# Patient Record
Sex: Female | Born: 1975 | Race: White | Hispanic: No | Smoking: Never smoker
Health system: Southern US, Community
[De-identification: ages and names within clinical notes are randomized; demographics above are authoritative.]

## PROBLEM LIST (undated history)

## (undated) ENCOUNTER — Inpatient Hospital Stay (HOSPITAL_COMMUNITY): Payer: Self-pay

## (undated) DIAGNOSIS — K219 Gastro-esophageal reflux disease without esophagitis: Secondary | ICD-10-CM

## (undated) DIAGNOSIS — N979 Female infertility, unspecified: Secondary | ICD-10-CM

## (undated) DIAGNOSIS — R519 Headache, unspecified: Secondary | ICD-10-CM

## (undated) DIAGNOSIS — F419 Anxiety disorder, unspecified: Secondary | ICD-10-CM

## (undated) DIAGNOSIS — Z87898 Personal history of other specified conditions: Secondary | ICD-10-CM

## (undated) DIAGNOSIS — Z973 Presence of spectacles and contact lenses: Secondary | ICD-10-CM

## (undated) DIAGNOSIS — D649 Anemia, unspecified: Secondary | ICD-10-CM

## (undated) DIAGNOSIS — F32A Depression, unspecified: Secondary | ICD-10-CM

## (undated) DIAGNOSIS — G43909 Migraine, unspecified, not intractable, without status migrainosus: Secondary | ICD-10-CM

## (undated) DIAGNOSIS — K449 Diaphragmatic hernia without obstruction or gangrene: Secondary | ICD-10-CM

## (undated) DIAGNOSIS — G4700 Insomnia, unspecified: Secondary | ICD-10-CM

## (undated) DIAGNOSIS — R51 Headache: Secondary | ICD-10-CM

## (undated) HISTORY — PX: OVUM / OOCYTE RETRIEVAL: SUR1269

## (undated) HISTORY — PX: TUBAL LIGATION: SHX77

## (undated) HISTORY — PX: HERNIA REPAIR: SHX51

## (undated) HISTORY — PX: WISDOM TOOTH EXTRACTION: SHX21

## (undated) HISTORY — PX: OTHER SURGICAL HISTORY: SHX169

---

## 1999-01-13 HISTORY — PX: TUBAL LIGATION: SHX77

## 2010-09-16 DIAGNOSIS — G4700 Insomnia, unspecified: Secondary | ICD-10-CM | POA: Insufficient documentation

## 2010-10-29 DIAGNOSIS — G43709 Chronic migraine without aura, not intractable, without status migrainosus: Secondary | ICD-10-CM | POA: Insufficient documentation

## 2012-01-13 HISTORY — PX: MICROTUBOPLASTY: SHX5401

## 2013-06-19 ENCOUNTER — Other Ambulatory Visit (HOSPITAL_COMMUNITY): Payer: Self-pay | Admitting: Obstetrics and Gynecology

## 2013-06-19 DIAGNOSIS — IMO0002 Reserved for concepts with insufficient information to code with codable children: Secondary | ICD-10-CM

## 2013-06-21 ENCOUNTER — Ambulatory Visit (HOSPITAL_COMMUNITY)
Admission: RE | Admit: 2013-06-21 | Discharge: 2013-06-21 | Disposition: A | Discharge status: Home / Self Care | Payer: Commercial Managed Care - PPO | Source: Ambulatory Visit | Attending: Obstetrics and Gynecology | Admitting: Obstetrics and Gynecology

## 2013-06-21 DIAGNOSIS — N979 Female infertility, unspecified: Secondary | ICD-10-CM | POA: Insufficient documentation

## 2013-06-21 DIAGNOSIS — Z310 Encounter for reversal of previous sterilization: Secondary | ICD-10-CM | POA: Insufficient documentation

## 2013-06-21 DIAGNOSIS — IMO0002 Reserved for concepts with insufficient information to code with codable children: Secondary | ICD-10-CM

## 2013-06-21 MED ORDER — IOHEXOL 300 MG/ML  SOLN
20.0000 mL | Freq: Once | INTRAMUSCULAR | Status: AC | PRN
  Administered 2013-06-21: 20 mL

## 2014-01-08 ENCOUNTER — Encounter (HOSPITAL_COMMUNITY): Payer: Self-pay | Admitting: *Deleted

## 2014-01-08 ENCOUNTER — Inpatient Hospital Stay (HOSPITAL_COMMUNITY): Payer: 59

## 2014-01-08 ENCOUNTER — Inpatient Hospital Stay (HOSPITAL_COMMUNITY)
Admission: AD | Admit: 2014-01-08 | Discharge: 2014-01-08 | Disposition: A | Discharge status: Home / Self Care | Payer: 59 | Source: Ambulatory Visit | Attending: Obstetrics & Gynecology | Admitting: Obstetrics & Gynecology

## 2014-01-08 DIAGNOSIS — Z3A01 Less than 8 weeks gestation of pregnancy: Secondary | ICD-10-CM | POA: Diagnosis not present

## 2014-01-08 DIAGNOSIS — O209 Hemorrhage in early pregnancy, unspecified: Secondary | ICD-10-CM | POA: Diagnosis not present

## 2014-01-08 DIAGNOSIS — O4691 Antepartum hemorrhage, unspecified, first trimester: Secondary | ICD-10-CM

## 2014-01-08 HISTORY — DX: Headache, unspecified: R51.9

## 2014-01-08 HISTORY — DX: Anemia, unspecified: D64.9

## 2014-01-08 HISTORY — DX: Headache: R51

## 2014-01-08 LAB — URINALYSIS, ROUTINE W REFLEX MICROSCOPIC
Bilirubin Urine: NEGATIVE
Glucose, UA: NEGATIVE mg/dL
KETONES UR: NEGATIVE mg/dL
LEUKOCYTES UA: NEGATIVE
Nitrite: NEGATIVE
Protein, ur: NEGATIVE mg/dL
Specific Gravity, Urine: <1.005 — ABNORMAL LOW (ref 1.005–1.030)
Urobilinogen, UA: 0.2 mg/dL (ref 0.0–1.0)
pH: 6.5 (ref 5.0–8.0)

## 2014-01-08 LAB — URINE MICROSCOPIC-ADD ON

## 2014-01-08 LAB — HCG, QUANTITATIVE, PREGNANCY: hCG, Beta Chain, Quant, S: 209 m[IU]/mL — ABNORMAL HIGH (ref <5)

## 2014-01-08 LAB — ABO/RH: ABO/RH(D): A POS

## 2014-01-08 NOTE — MAU Provider Note (Signed)
First Provider Initiated Contact with Patient 01/08/14 1913      Chief Complaint:  Vaginal Bleeding   Nicole David is  38 y.o. [id number] at 6.3 weeks by LMP presents complaining of Vaginal Bleeding  She started having some scant pink bleeding, mainly just when wiping, 12/15. It has gotten a little heavier over the past few days, but still light (just wearing panty liners).  No recent intercourse.  Denies cramping.  Had + QHCG at [facility] 12/15, and a + UPT at [facility] shortly after that.  Has not had an [place] yet.    Obstetrical/Gynecological History: OB History    Gravida Para Term Preterm AB TAB SAB Ectopic Multiple Living   5 4 1 3      4      Past Medical History: Past Medical History  Diagnosis Date  . Anemia   . Headache     Past Surgical History: Past Surgical History  Procedure Laterality Date  . Tubal ligation    . Tubal reversal      2014  . Wisdom tooth extraction      Family History: History reviewed. No pertinent family history.  Social History: History  Substance Use Topics  . Smoking status: Never Smoker   . Smokeless tobacco: Not on file  . Alcohol Use: No    Allergies:  Allergies  Allergen Reactions  . Sulfa Antibiotics Nausea And Vomiting and Rash  . Zithromax [Azithromycin] Rash  . Zyrtec [Cetirizine] Rash    Meds:  Prescriptions prior to admission  Medication Sig Dispense Refill Last Dose  . Prenatal Vit-Fe Fumarate-FA (PRENATAL MULTIVITAMIN) TABS tablet Take 1 tablet by mouth daily at 12 noon.   01/08/2014 at Unknown time    Review of Systems   Constitutional: Negative for fever and chills Eyes: Negative for visual disturbances Respiratory: Negative for shortness of breath, dyspnea Cardiovascular: Negative for chest pain or palpitations  Gastrointestinal: Negative for vomiting, diarrhea and constipation Genitourinary: Negative for dysuria and urgency.  No vaginal itching, irritation, change in discharge. Musculoskeletal:  Negative for back pain, joint pain, myalgias  Neurological: Negative for dizziness and headaches     Physical Exam  Blood pressure 116/71, pulse 76, temperature 98.9 F (37.2 C), temperature source Oral, resp. rate 16, weight 62.143 kg (137 lb), last menstrual period 11/24/2013. GENERAL: Well-developed, well-nourished female in no acute distress.  LUNGS: Clear to auscultation bilaterally.  HEART: Regular rate and rhythm. ABDOMEN: Soft, nontender, nondistended, gravid.  EXTREMITIES: Nontender, no edema, 2+ distal pulses. DTR's 2+ PELVIC:  PT REFUSED PELVIC EXAM.  Concerned "it could make things worse".  Advised that a pelvic exam is indicated to R/O subclinical infection (a primary cause of VB ). Pt understands and declines  Labs: Results for orders placed or performed during the hospital encounter of 01/08/14 (from the past 24 hour(s))  Urinalysis, Routine w reflex microscopic   Collection Time: 01/08/14  6:52 PM  Result Value Ref Range   Color, Urine YELLOW YELLOW   APPearance CLEAR CLEAR   Specific Gravity, Urine <1.005 (L) 1.005 - 1.030   pH 6.5 5.0 - 8.0   Glucose, UA NEGATIVE NEGATIVE mg/dL   Hgb urine dipstick LARGE (A) NEGATIVE   Bilirubin Urine NEGATIVE NEGATIVE   Ketones, ur NEGATIVE NEGATIVE mg/dL   Protein, ur NEGATIVE NEGATIVE mg/dL   Urobilinogen, UA 0.2 0.0 - 1.0 mg/dL   Nitrite NEGATIVE NEGATIVE   Leukocytes, UA NEGATIVE NEGATIVE  Urine microscopic-add on   Collection Time: 01/08/14  6:52 PM  Result Value Ref Range   Squamous Epithelial / LPF RARE RARE   WBC, UA 0-2 <3 WBC/hpf   RBC / HPF 7-10 <3 RBC/hpf   Bacteria, UA FEW (A) RARE   Imaging Studies:  CLINICAL DATA: Vaginal bleeding in first trimester pregnancy. Quantitative beta HCG 209.  EXAM: OBSTETRIC <14 WK [place] AND TRANSVAGINAL OB US  TECHNIQUE: Both transabdominal and transvaginal ultrasound examinations were performed for complete evaluation of the gestation as well as the maternal uterus,  adnexal regions, and pelvic cul-de-sac. Transvaginal technique was performed to assess early pregnancy.  COMPARISON: None.  FINDINGS: No intra or extrauterine gestational sac identified. No adnexal mass. The ovaries are symmetric in size and normal in appearance. There is no free pelvic fluid.  IMPRESSION: Pregnancy of unknown location. Differential considerations include intrauterine gestation too early to be sonographically visualized, spontaneous abortion, or ectopic pregnancy. Consider follow-up ultrasound in 10 days and serial quantitative beta HCG follow-up.   Electronically Signed  By: Jorje Guild M.D.  On: 01/08/2014 21:30   Assessment: Nicole David is  38 y.o. [id number] at Unknown presents with viable vs noviable pregnancy; intrauterine vs ectopic.  Plan: Ectopic precautions.  Discussed with Dr. Alwyn Pea.  Pt to F/U Wed afternoon (pt prefers Thursday am) for [facility]  CRESENZO-DISHMAN,Rekita Miotke 12/28/20157:54 PM  '

## 2014-01-08 NOTE — MAU Note (Signed)
In restroom when called

## 2014-01-08 NOTE — MAU Note (Signed)
Called dr's office, has been having some bleeding since the 25th. Was told to come in. Was a little heavier.  Normally sees it when she wipes after using restroom. Started as hot pink, became bright red, now has slowed some and is pinkish to red.

## 2014-01-17 ENCOUNTER — Inpatient Hospital Stay (HOSPITAL_COMMUNITY)
Admission: AD | Admit: 2014-01-17 | Discharge: 2014-01-17 | Disposition: A | Discharge status: Home / Self Care | Payer: 59 | Source: Ambulatory Visit | Attending: Obstetrics | Admitting: Obstetrics

## 2014-01-17 DIAGNOSIS — O001 Tubal pregnancy: Secondary | ICD-10-CM | POA: Diagnosis not present

## 2014-01-17 LAB — CBC WITH DIFFERENTIAL/PLATELET
BASOS PCT: 0 % (ref 0–1)
Basophils Absolute: 0 10*3/uL (ref 0.0–0.1)
Eosinophils Absolute: 0.3 10*3/uL (ref 0.0–0.7)
Eosinophils Relative: 3 % (ref 0–5)
HCT: 40.2 % (ref 36.0–46.0)
Hemoglobin: 14.1 g/dL (ref 12.0–15.0)
LYMPHS ABS: 2.4 10*3/uL (ref 0.7–4.0)
LYMPHS PCT: 24 % (ref 12–46)
MCH: 32.9 pg (ref 26.0–34.0)
MCHC: 35.1 g/dL (ref 30.0–36.0)
MCV: 93.7 fL (ref 78.0–100.0)
MONO ABS: 0.7 10*3/uL (ref 0.1–1.0)
MONOS PCT: 7 % (ref 3–12)
NEUTROS ABS: 6.3 10*3/uL (ref 1.7–7.7)
Neutrophils Relative %: 66 % (ref 43–77)
PLATELETS: 180 10*3/uL (ref 150–400)
RBC: 4.29 MIL/uL (ref 3.87–5.11)
RDW: 12.2 % (ref 11.5–15.5)
WBC: 9.7 10*3/uL (ref 4.0–10.5)

## 2014-01-17 LAB — CREATININE, SERUM: CREATININE: 0.65 mg/dL (ref 0.50–1.10)

## 2014-01-17 LAB — HCG, QUANTITATIVE, PREGNANCY: HCG, BETA CHAIN, QUANT, S: 212 m[IU]/mL — AB (ref <5)

## 2014-01-17 LAB — BUN: BUN: 10 mg/dL (ref 6–23)

## 2014-01-17 LAB — AST: AST: 22 U/L (ref 0–37)

## 2014-01-17 MED ORDER — METHOTREXATE INJECTION FOR WOMEN'S HOSPITAL
50.0000 mg/m2 | Freq: Once | INTRAMUSCULAR | Status: AC
  Administered 2014-01-17: 80 mg via INTRAMUSCULAR
  Filled 2014-01-17: qty 1.6

## 2014-01-17 NOTE — MAU Note (Signed)
Verbal orders from Dr. Ouida Sills received for Beta HCG quant.  MD will come put orders in for Methotrexate up arrival.

## 2014-01-17 NOTE — MAU Note (Signed)
Dr sent her in for methotrexate.   preg hormone level  Not really changing, no preg seen on [place]

## 2014-01-17 NOTE — MAU Note (Signed)
hcg level 209 12/28 here in MAU, 2 days later 213, today 204.  Nothing in uterus.  Hx of tubal reversal.  Due to risk, Dr Ouida Sills wanting MTX.  Pt will return here for follow up blood work. Pt may leave while lab is processing per MD.

## 2014-01-20 ENCOUNTER — Inpatient Hospital Stay (HOSPITAL_COMMUNITY)
Admission: AD | Admit: 2014-01-20 | Discharge: 2014-01-20 | Disposition: A | Discharge status: Home / Self Care | Payer: 59 | Source: Ambulatory Visit | Attending: Obstetrics and Gynecology | Admitting: Obstetrics and Gynecology

## 2014-01-20 ENCOUNTER — Telehealth: Payer: Self-pay | Admitting: Advanced Practice Midwife

## 2014-01-20 DIAGNOSIS — O001 Tubal pregnancy: Secondary | ICD-10-CM | POA: Diagnosis not present

## 2014-01-20 LAB — HCG, QUANTITATIVE, PREGNANCY: hCG, Beta Chain, Quant, S: 165 m[IU]/mL — ABNORMAL HIGH (ref <5)

## 2014-01-20 NOTE — MAU Note (Signed)
Monna Fam NP in Triage to see pt. PT does not want to wait for lab results. Only [place] and understands may need to return depending on lab results. Have # [phone] to contact pt with results. Understands to return Day 7 for repeat labs or sooner for any problems. Will be seen wkly at office for follow up. To lobby and lab called

## 2014-01-20 NOTE — Telephone Encounter (Signed)
Left message for pt to return call about lab results.  Pt Day 4 labs following MTX indicate quant hcg decrease.  She should return on Day 7 as scheduled.  Pt returned call.  Lab result explained and questions answered.  Pt to return on Day 7 as scheduled.

## 2014-01-20 NOTE — MAU Note (Signed)
Fatima Blank CNM notified of lab results. She will call pt at home with results and discuss plan of care for f/u labs on day #7

## 2014-01-20 NOTE — MAU Note (Signed)
Here for repeat BHCG  After receiving MTX on Weds 1/6. Denies any problems or pain

## 2014-01-21 NOTE — Telephone Encounter (Signed)
Left message for pt to return call about lab results.  Pt Day 4 labs following MTX indicate quant hcg decrease.  She should return on Day 7 as scheduled.  Pt returned call.  Info given.

## 2014-01-23 ENCOUNTER — Inpatient Hospital Stay (HOSPITAL_COMMUNITY)
Admission: AD | Admit: 2014-01-23 | Discharge: 2014-01-23 | Disposition: A | Discharge status: Home / Self Care | Payer: 59 | Source: Ambulatory Visit | Attending: Obstetrics & Gynecology | Admitting: Obstetrics & Gynecology

## 2014-01-23 DIAGNOSIS — O009 Unspecified ectopic pregnancy without intrauterine pregnancy: Secondary | ICD-10-CM

## 2014-01-23 DIAGNOSIS — O001 Tubal pregnancy: Secondary | ICD-10-CM | POA: Insufficient documentation

## 2014-01-23 LAB — HCG, QUANTITATIVE, PREGNANCY: hCG, Beta Chain, Quant, S: 129 m[IU]/mL — ABNORMAL HIGH (ref <5)

## 2014-01-23 NOTE — MAU Note (Signed)
Pt reports no vaginal bleeding and no cramping today.

## 2014-01-23 NOTE — MAU Provider Note (Signed)
Pt is post MTX Day 7 Pt was treated on 01/17/2014 by Dr. Ouida Sills Day 1 HCG 212 01/20/2014 Day 4 HCG-165 [id number] Day 7 HCG 129 (today) Pt denies spotting/bleeding cramping        Results for [facility] [employer] [facility] (MRN [id number]) as of 01/23/2014 21:56  Ref. Range 01/08/2014 21:25 01/17/2014 17:10 01/20/2014 19:45 01/23/2014 19:45  hCG, Beta Chain, Quant, S Latest Range: <5 mIU/mL  212 (H) 165 (H) 129 (H)   Results reported to Dr. Alwyn Pea Pt to follow up in 1 week (Wed 01/31/2014) with dr. Tonette Bihari office [place] if increase in pain or bleeding Sherolyn Buba, WHNP-BC

## 2014-11-13 ENCOUNTER — Encounter (HOSPITAL_COMMUNITY): Payer: Self-pay | Admitting: *Deleted

## 2015-06-23 IMAGING — RF DG HYSTEROGRAM
6 series · 6 of 6 positions shown · non-contrast
Comparison: none

[Series 1: run · 1 of 1 slices shown (1 of 6)]
[im 1/1]
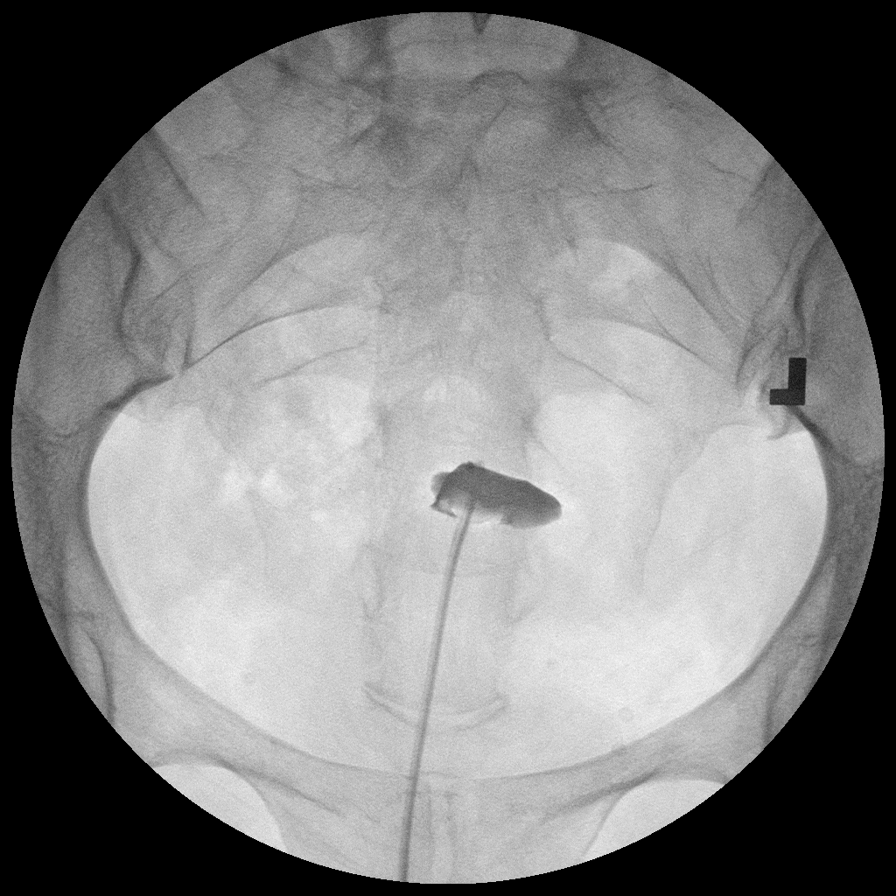

[Series 2: run · 1 of 1 slices shown (2 of 6)]
[im 1/1]
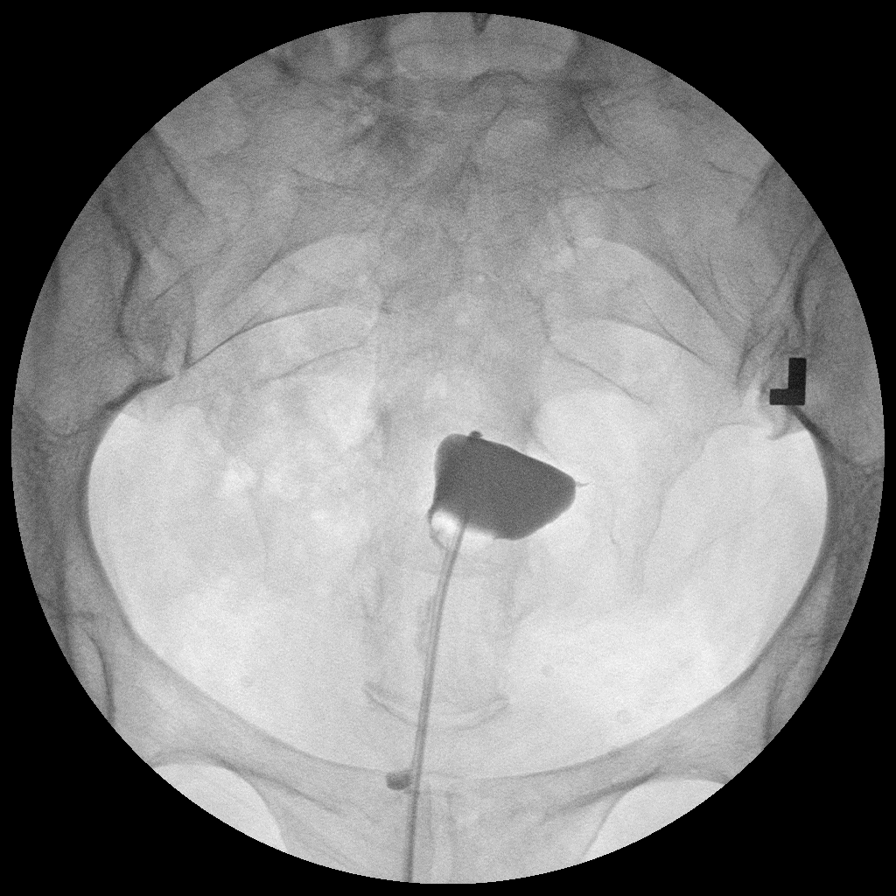

[Series 3: run · 1 of 1 slices shown (3 of 6)]
[im 1/1]
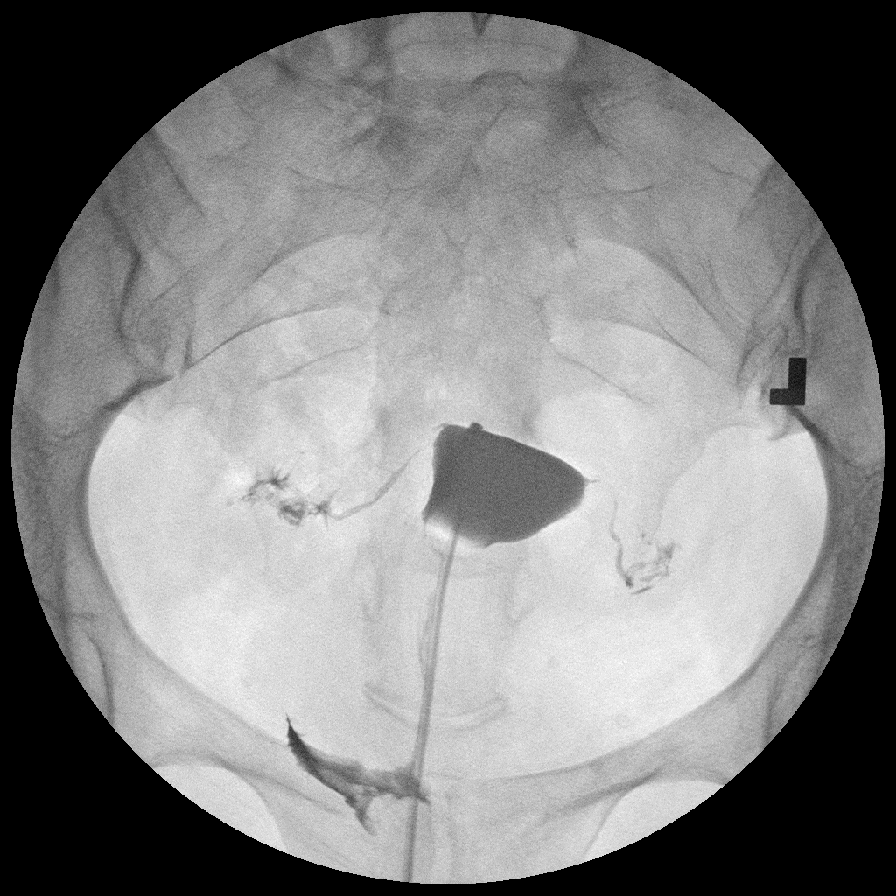

[Series 4: run · 1 of 1 slices shown (4 of 6)]
[im 1/1]
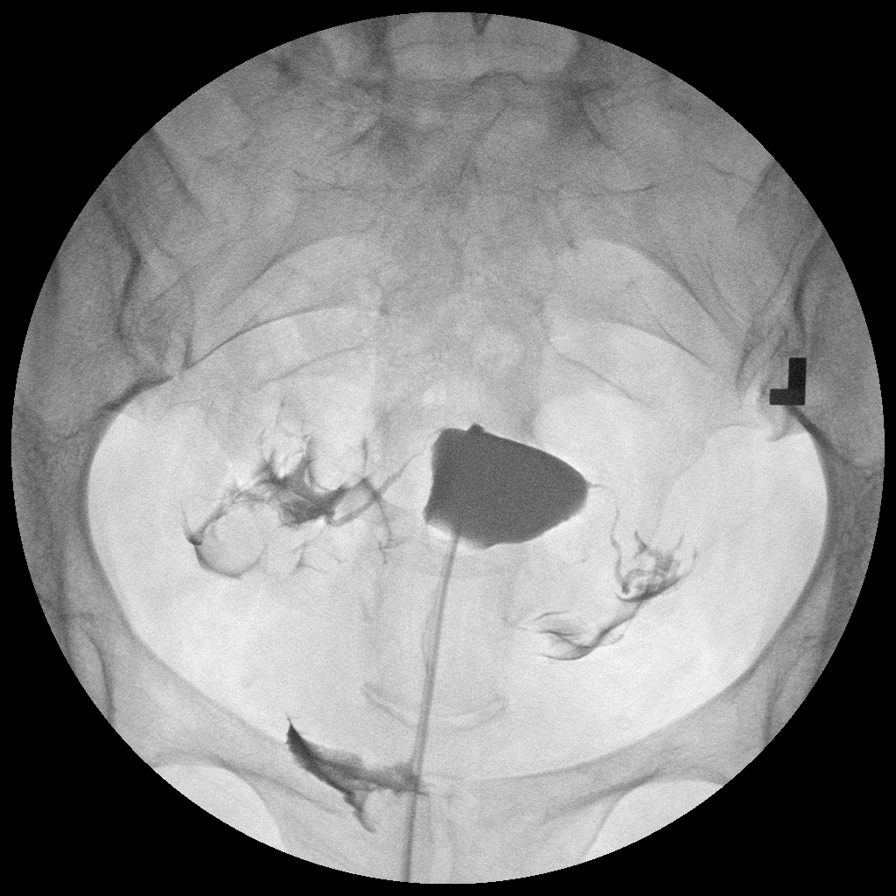

[Series 5: run · 1 of 1 slices shown (5 of 6)]
[im 1/1]
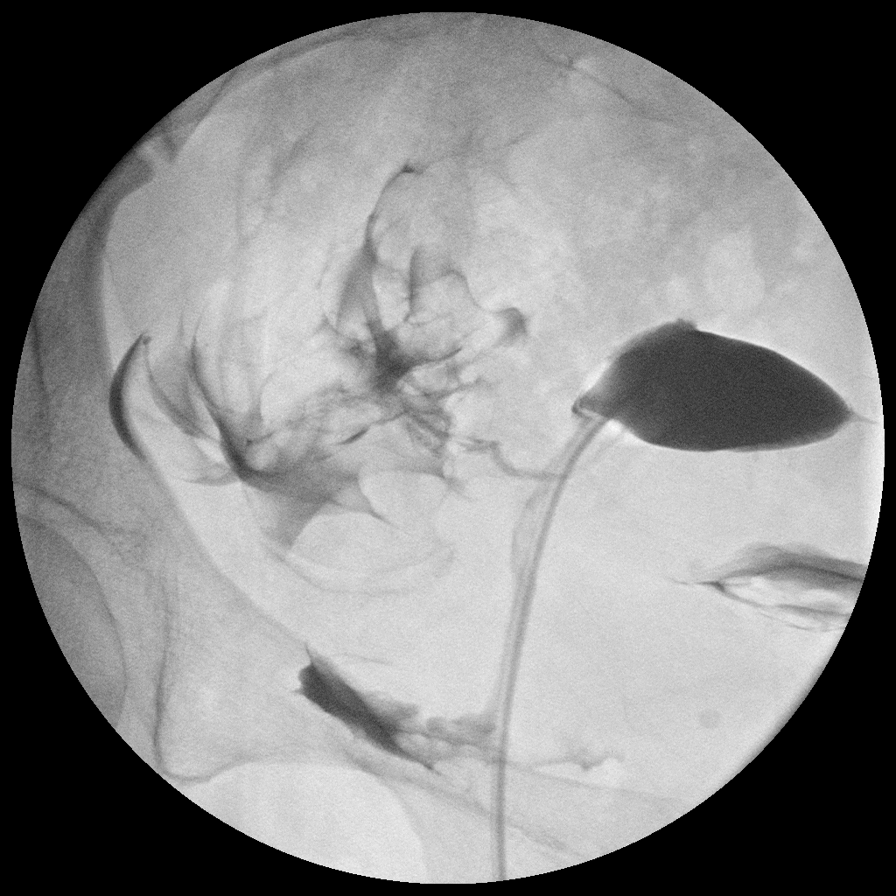

[Series 6: run · 1 of 1 slices shown (6 of 6)]
[im 1/1]
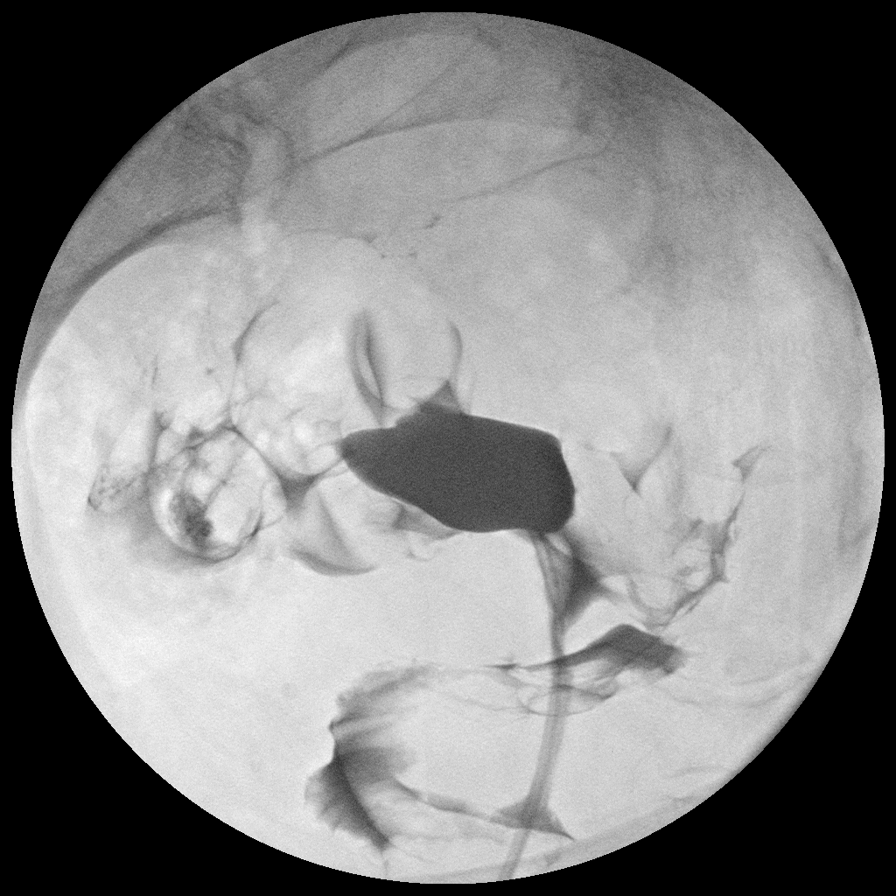

[6 of 6 positions shown; findings below may reference images not displayed]

Canned report from images found in remote index.

Refer to host system for actual result text.

## 2016-01-10 IMAGING — US US OB COMP LESS 14 WK
1 series · 14 of 28 positions shown · non-contrast
Comparison: None.

CLINICAL DATA: Vaginal bleeding in first trimester pregnancy.
Quantitative beta HCG 209.

EXAM:
OBSTETRIC <14 WK US AND TRANSVAGINAL OB US
TECHNIQUE: Both transabdominal and transvaginal ultrasound examinations were
performed for complete evaluation of the gestation as well as the
maternal uterus, adnexal regions, and pelvic cul-de-sac.
Transvaginal technique was performed to assess early pregnancy.

[Series 1: us ob comp less 14 wks · 14 of 47 slices shown]
[im 2/47]
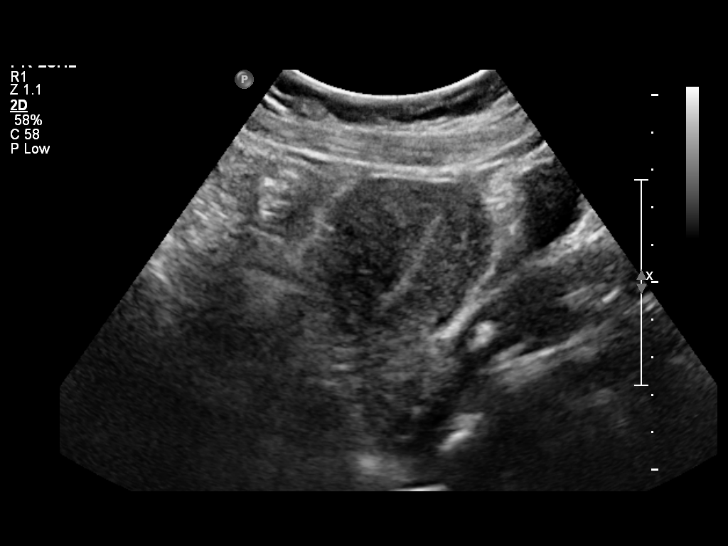
[im 6/47]
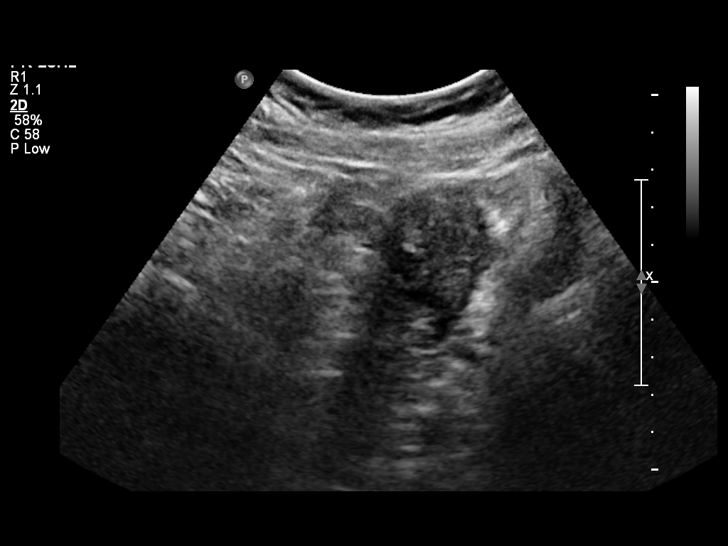
[im 9/47]
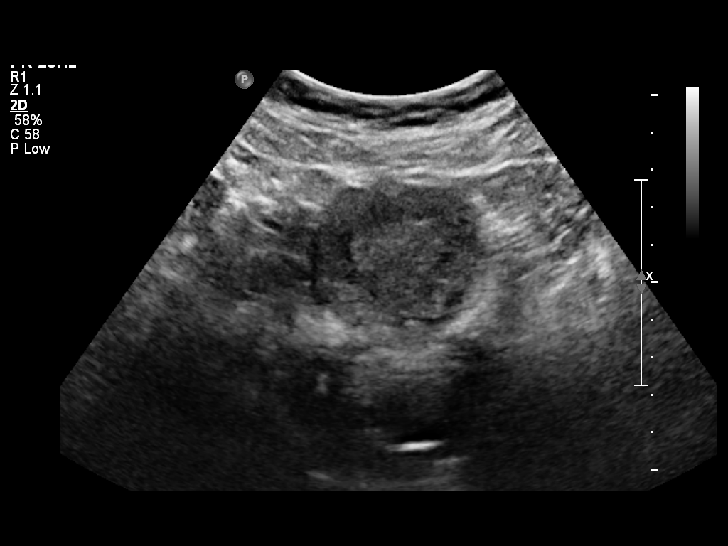
[im 12/47]
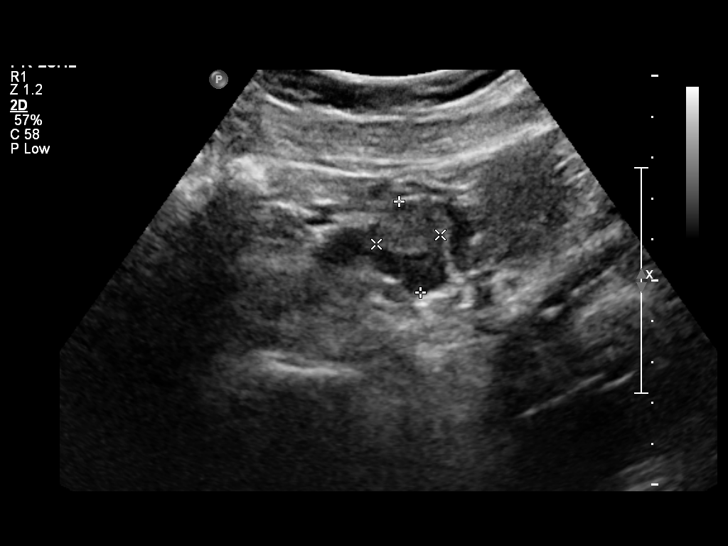
[im 16/47]
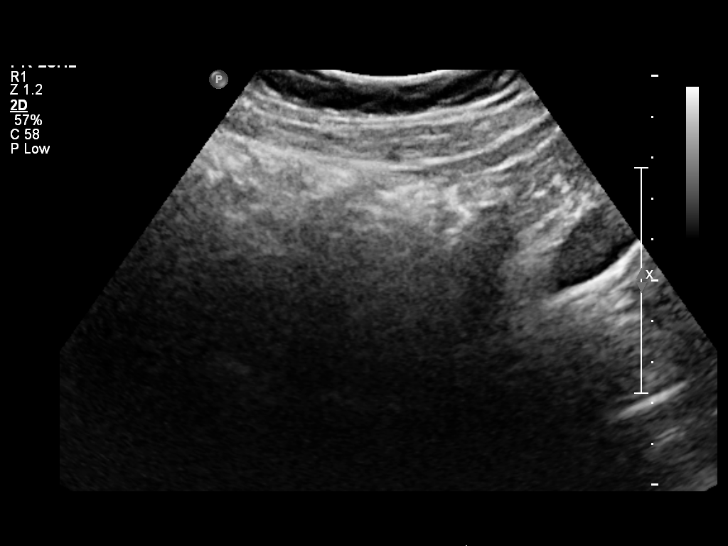
[im 19/47]
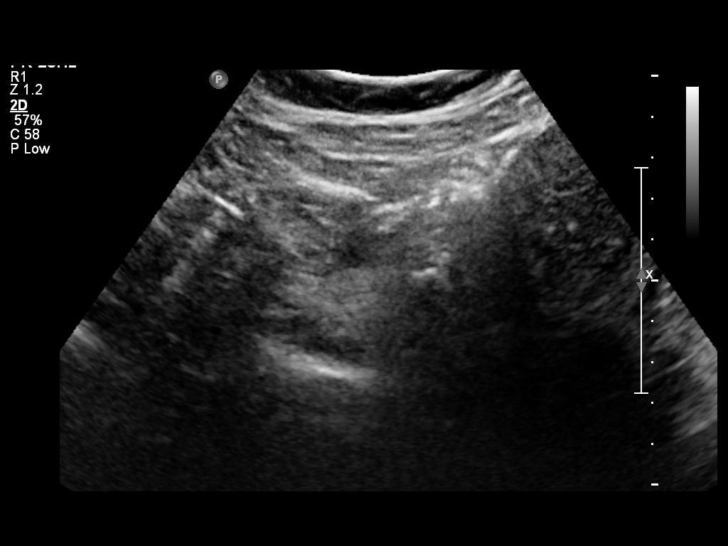
[im 23/47]
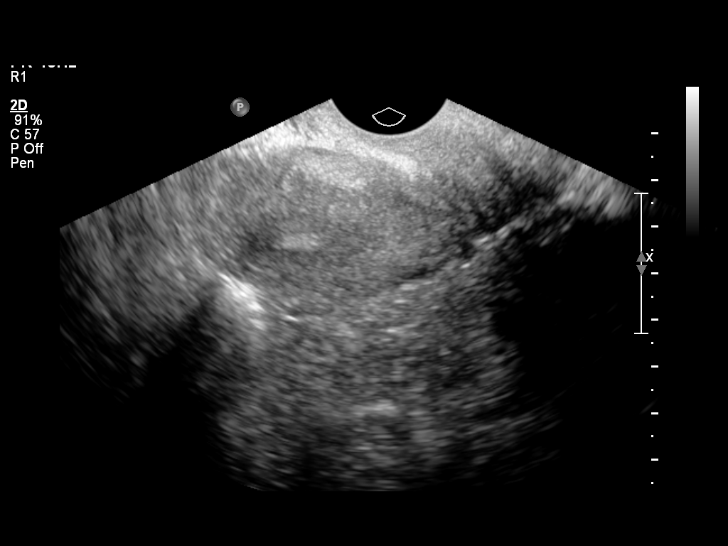
[im 26/47]
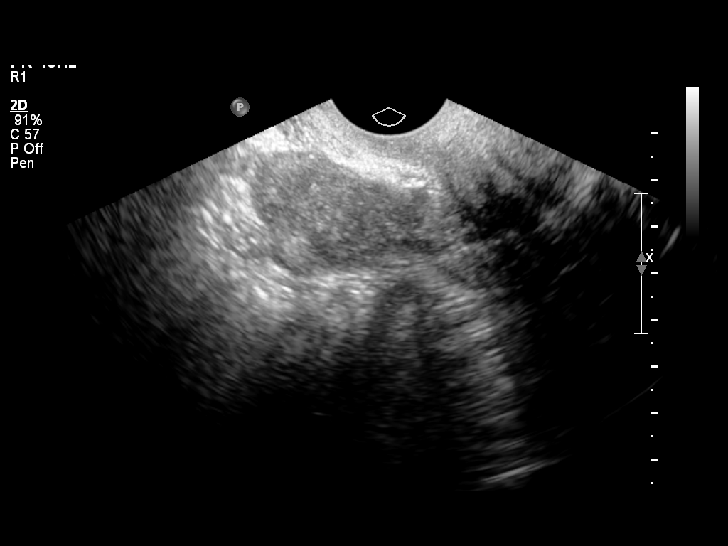
[im 29/47]
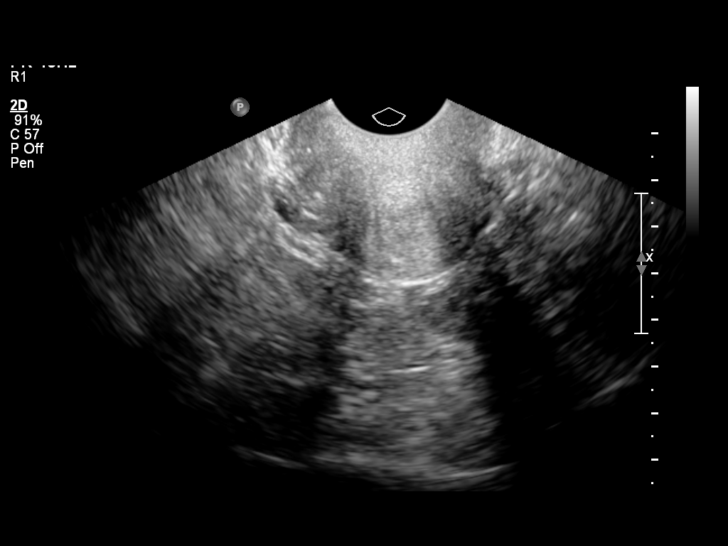
[im 33/47]
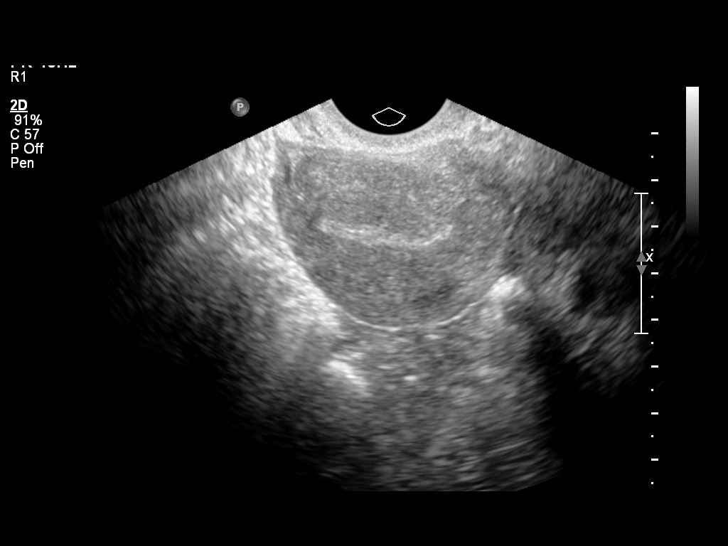
[im 36/47]
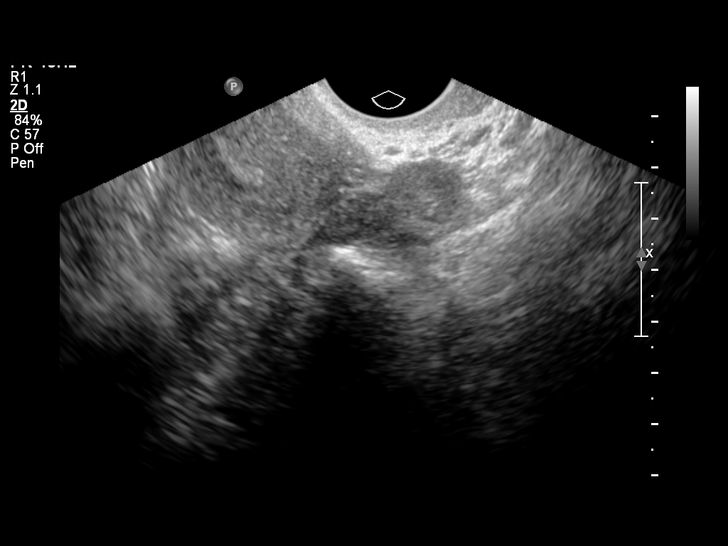
[im 40/47]
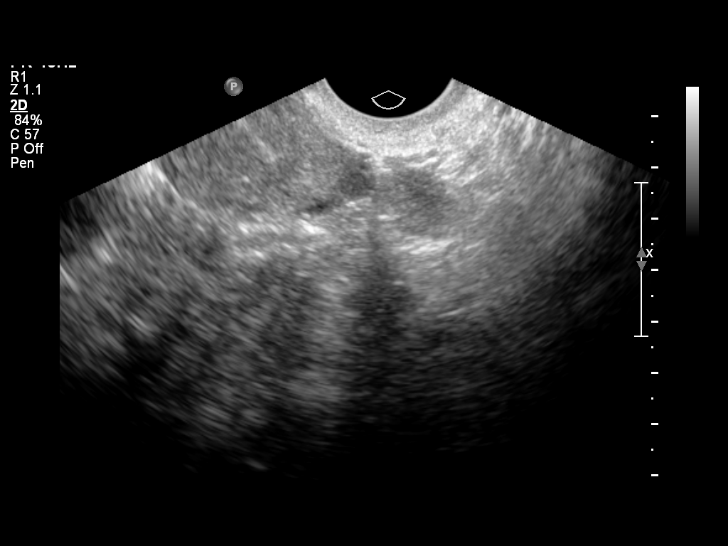
[im 43/47]
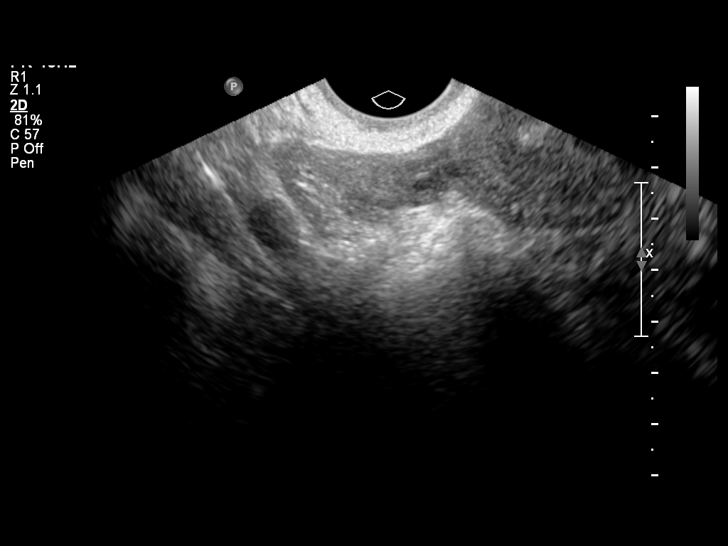
[im 47/47]
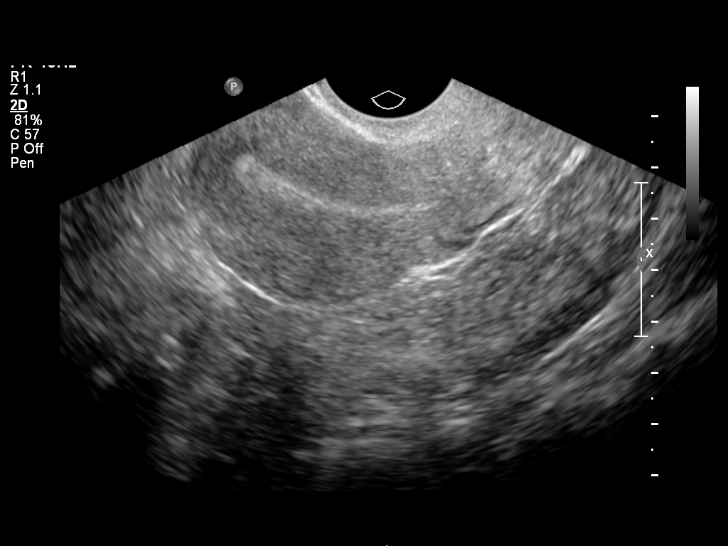

[14 of 28 positions shown; findings below may reference images not displayed]

FINDINGS: No intra or extrauterine gestational sac identified. No adnexal
mass. The ovaries are symmetric in size and normal in appearance.
There is no free pelvic fluid.
IMPRESSION: Pregnancy of unknown location. Differential considerations include
intrauterine gestation too early to be sonographically visualized,
spontaneous abortion, or ectopic pregnancy. Consider follow-up
ultrasound in 10 days and serial quantitative beta HCG follow-up.

## 2018-06-10 DIAGNOSIS — E538 Deficiency of other specified B group vitamins: Secondary | ICD-10-CM | POA: Insufficient documentation

## 2018-06-10 DIAGNOSIS — E559 Vitamin D deficiency, unspecified: Secondary | ICD-10-CM | POA: Insufficient documentation

## 2019-02-17 DIAGNOSIS — D1771 Benign lipomatous neoplasm of kidney: Secondary | ICD-10-CM | POA: Insufficient documentation

## 2019-08-13 HISTORY — PX: ESOPHAGOGASTRODUODENOSCOPY: SHX1529

## 2020-05-31 ENCOUNTER — Other Ambulatory Visit: Payer: Self-pay

## 2020-05-31 ENCOUNTER — Encounter (HOSPITAL_BASED_OUTPATIENT_CLINIC_OR_DEPARTMENT_OTHER): Payer: Self-pay | Admitting: Obstetrics and Gynecology

## 2020-05-31 NOTE — Progress Notes (Signed)
Spoke w/ via phone for pre-op interview--- Pt Lab needs dos----  No (per anes)/  Pre-op orders pending             Lab results------ no COVID test -----patient states asymptomatic no test needed Arrive at ------- 0530 on 06-04-2020 NPO after MN NO Solid Food.  Clear liquids from MN until--- 0430 Med rec completed Medications to take morning of surgery ----- Singulair Diabetic medication ----- n/a Patient instructed to bring photo id and insurance card day of surgery Patient aware to have Driver (ride ) / caregiver    for 24 hours after surgery -- sig other, Thurman Coyer Patient Special Instructions ----- n/a Pre-Op special Istructions ----- case just added on today, orders pending Patient verbalized understanding of instructions that were given at this phone interview. Patient denies shortness of breath, chest pain, fever, cough at this phone interview.

## 2020-06-03 NOTE — Anesthesia Preprocedure Evaluation (Addendum)
Anesthesia Evaluation  Patient identified by MRN, date of birth, ID band Patient awake    Reviewed: Allergy & Precautions, NPO status , Patient's Chart, lab work & pertinent test results  History of Anesthesia Complications Negative for: history of anesthetic complications  Airway Mallampati: I  TM Distance: >3 FB Neck ROM: Full    Dental  (+) Dental Advisory Given   Pulmonary  Seasonal allergies   breath sounds clear to auscultation       Cardiovascular negative cardio ROS   Rhythm:Regular Rate:Normal     Neuro/Psych  Headaches, Anxiety Depression    GI/Hepatic Neg liver ROS, GERD  Poorly Controlled,  Endo/Other  negative endocrine ROS  Renal/GU negative Renal ROS     Musculoskeletal   Abdominal   Peds  Hematology negative hematology ROS (+)   Anesthesia Other Findings   Reproductive/Obstetrics (+) Pregnancy                            Anesthesia Physical Anesthesia Plan  ASA: II  Anesthesia Plan: General   Post-op Pain Management:    Induction: Intravenous  PONV Risk Score and Plan: 3 and Ondansetron, Dexamethasone and Scopolamine patch - Pre-op  Airway Management Planned: Oral ETT  Additional Equipment: None  Intra-op Plan:   Post-operative Plan: Extubation in OR  Informed Consent: I have reviewed the patients History and Physical, chart, labs and discussed the procedure including the risks, benefits and alternatives for the proposed anesthesia with the patient or authorized representative who has indicated his/her understanding and acceptance.     Dental advisory given  Plan Discussed with: CRNA and Surgeon  Anesthesia Plan Comments:        Anesthesia Quick Evaluation

## 2020-06-04 ENCOUNTER — Encounter (HOSPITAL_BASED_OUTPATIENT_CLINIC_OR_DEPARTMENT_OTHER)
Admission: RE | Disposition: A | Discharge status: Home / Self Care | Payer: Self-pay | Source: Home / Self Care | Attending: Obstetrics and Gynecology

## 2020-06-04 ENCOUNTER — Ambulatory Visit (HOSPITAL_BASED_OUTPATIENT_CLINIC_OR_DEPARTMENT_OTHER): Payer: BC Managed Care – PPO | Admitting: Anesthesiology

## 2020-06-04 ENCOUNTER — Other Ambulatory Visit: Payer: Self-pay

## 2020-06-04 ENCOUNTER — Encounter (HOSPITAL_BASED_OUTPATIENT_CLINIC_OR_DEPARTMENT_OTHER): Payer: Self-pay | Admitting: Obstetrics and Gynecology

## 2020-06-04 ENCOUNTER — Ambulatory Visit (HOSPITAL_BASED_OUTPATIENT_CLINIC_OR_DEPARTMENT_OTHER)
Admission: RE | Admit: 2020-06-04 | Discharge: 2020-06-04 | Disposition: A | Discharge status: Home / Self Care | Payer: BC Managed Care – PPO | Attending: Obstetrics and Gynecology | Admitting: Obstetrics and Gynecology

## 2020-06-04 DIAGNOSIS — Z881 Allergy status to other antibiotic agents status: Secondary | ICD-10-CM | POA: Diagnosis not present

## 2020-06-04 DIAGNOSIS — O411090 Infection of amniotic sac and membranes, unspecified, unspecified trimester, not applicable or unspecified: Secondary | ICD-10-CM | POA: Insufficient documentation

## 2020-06-04 DIAGNOSIS — Z3A00 Weeks of gestation of pregnancy not specified: Secondary | ICD-10-CM | POA: Insufficient documentation

## 2020-06-04 DIAGNOSIS — Z882 Allergy status to sulfonamides status: Secondary | ICD-10-CM | POA: Insufficient documentation

## 2020-06-04 DIAGNOSIS — Z8719 Personal history of other diseases of the digestive system: Secondary | ICD-10-CM | POA: Diagnosis not present

## 2020-06-04 DIAGNOSIS — Z79899 Other long term (current) drug therapy: Secondary | ICD-10-CM | POA: Insufficient documentation

## 2020-06-04 DIAGNOSIS — Z888 Allergy status to other drugs, medicaments and biological substances status: Secondary | ICD-10-CM | POA: Diagnosis not present

## 2020-06-04 DIAGNOSIS — O021 Missed abortion: Secondary | ICD-10-CM | POA: Insufficient documentation

## 2020-06-04 HISTORY — DX: Migraine, unspecified, not intractable, without status migrainosus: G43.909

## 2020-06-04 HISTORY — DX: Female infertility, unspecified: N97.9

## 2020-06-04 HISTORY — PX: DILATION AND EVACUATION: SHX1459

## 2020-06-04 HISTORY — DX: Presence of spectacles and contact lenses: Z97.3

## 2020-06-04 HISTORY — DX: Anxiety disorder, unspecified: F41.9

## 2020-06-04 HISTORY — DX: Personal history of other specified conditions: Z87.898

## 2020-06-04 HISTORY — DX: Insomnia, unspecified: G47.00

## 2020-06-04 HISTORY — DX: Diaphragmatic hernia without obstruction or gangrene: K44.9

## 2020-06-04 HISTORY — DX: Depression, unspecified: F32.A

## 2020-06-04 HISTORY — DX: Gastro-esophageal reflux disease without esophagitis: K21.9

## 2020-06-04 LAB — CBC
HCT: 40.8 % (ref 36.0–46.0)
Hemoglobin: 14.2 g/dL (ref 12.0–15.0)
MCH: 32.7 pg (ref 26.0–34.0)
MCHC: 34.8 g/dL (ref 30.0–36.0)
MCV: 94 fL (ref 80.0–100.0)
Platelets: 169 10*3/uL (ref 150–400)
RBC: 4.34 MIL/uL (ref 3.87–5.11)
RDW: 12.8 % (ref 11.5–15.5)
WBC: 12.5 10*3/uL — ABNORMAL HIGH (ref 4.0–10.5)
nRBC: 0 % (ref 0.0–0.2)

## 2020-06-04 LAB — PREPARE RBC (CROSSMATCH)

## 2020-06-04 SURGERY — DILATION AND EVACUATION, UTERUS
Anesthesia: General | Site: Vagina

## 2020-06-04 MED ORDER — EPHEDRINE 5 MG/ML INJ
INTRAVENOUS | Status: AC
  Filled 2020-06-04: qty 10

## 2020-06-04 MED ORDER — SUCCINYLCHOLINE CHLORIDE 200 MG/10ML IV SOSY
PREFILLED_SYRINGE | INTRAVENOUS | Status: AC
  Filled 2020-06-04: qty 10

## 2020-06-04 MED ORDER — LIDOCAINE 2% (20 MG/ML) 5 ML SYRINGE
INTRAMUSCULAR | Status: AC
  Filled 2020-06-04: qty 5

## 2020-06-04 MED ORDER — CARBOPROST TROMETHAMINE 250 MCG/ML IM SOLN
INTRAMUSCULAR | Status: AC
  Filled 2020-06-04: qty 1

## 2020-06-04 MED ORDER — ONDANSETRON HCL 4 MG/2ML IJ SOLN
INTRAMUSCULAR | Status: AC
  Filled 2020-06-04: qty 2

## 2020-06-04 MED ORDER — PROMETHAZINE HCL 25 MG/ML IJ SOLN: 6.2500 mg | INTRAMUSCULAR | Status: DC | PRN

## 2020-06-04 MED ORDER — MEPERIDINE HCL 25 MG/ML IJ SOLN: 6.2500 mg | INTRAMUSCULAR | Status: DC | PRN

## 2020-06-04 MED ORDER — PROPOFOL 500 MG/50ML IV EMUL
INTRAVENOUS | Status: AC
  Filled 2020-06-04: qty 50

## 2020-06-04 MED ORDER — METHYLERGONOVINE MALEATE 0.2 MG/ML IJ SOLN
INTRAMUSCULAR | Status: AC
  Filled 2020-06-04: qty 1

## 2020-06-04 MED ORDER — FENTANYL CITRATE (PF) 100 MCG/2ML IJ SOLN
INTRAMUSCULAR | Status: AC
  Filled 2020-06-04: qty 2

## 2020-06-04 MED ORDER — FENTANYL CITRATE (PF) 100 MCG/2ML IJ SOLN: 25.0000 ug | INTRAMUSCULAR | Status: DC | PRN

## 2020-06-04 MED ORDER — FENTANYL CITRATE (PF) 100 MCG/2ML IJ SOLN
INTRAMUSCULAR | Status: DC | PRN
  Administered 2020-06-04: 100 ug via INTRAVENOUS

## 2020-06-04 MED ORDER — PROPOFOL 10 MG/ML IV BOLUS
INTRAVENOUS | Status: DC | PRN
  Administered 2020-06-04: 200 mg via INTRAVENOUS

## 2020-06-04 MED ORDER — OXYCODONE HCL 5 MG PO TABS: 5.0000 mg | ORAL_TABLET | Freq: Once | ORAL | Status: DC | PRN

## 2020-06-04 MED ORDER — LACTATED RINGERS IV SOLN: INTRAVENOUS | Status: DC

## 2020-06-04 MED ORDER — MIDAZOLAM HCL 5 MG/5ML IJ SOLN
INTRAMUSCULAR | Status: DC | PRN
  Administered 2020-06-04: 2 mg via INTRAVENOUS

## 2020-06-04 MED ORDER — MIDAZOLAM HCL 2 MG/2ML IJ SOLN: 0.5000 mg | Freq: Once | INTRAMUSCULAR | Status: DC | PRN

## 2020-06-04 MED ORDER — KETOROLAC TROMETHAMINE 30 MG/ML IJ SOLN
INTRAMUSCULAR | Status: AC
  Filled 2020-06-04: qty 1

## 2020-06-04 MED ORDER — DEXAMETHASONE SODIUM PHOSPHATE 10 MG/ML IJ SOLN
INTRAMUSCULAR | Status: AC
  Filled 2020-06-04: qty 1

## 2020-06-04 MED ORDER — MIDAZOLAM HCL 2 MG/2ML IJ SOLN
INTRAMUSCULAR | Status: AC
  Filled 2020-06-04: qty 2

## 2020-06-04 MED ORDER — LIDOCAINE HCL 1 % IJ SOLN
INTRAMUSCULAR | Status: DC | PRN
  Administered 2020-06-04: 10 mL

## 2020-06-04 MED ORDER — KETOROLAC TROMETHAMINE 30 MG/ML IJ SOLN
INTRAMUSCULAR | Status: DC | PRN
  Administered 2020-06-04: 30 mg via INTRAVENOUS

## 2020-06-04 MED ORDER — EPHEDRINE SULFATE 50 MG/ML IJ SOLN
INTRAMUSCULAR | Status: DC | PRN
  Administered 2020-06-04: 10 mg via INTRAVENOUS

## 2020-06-04 MED ORDER — SODIUM CHLORIDE 0.9% IV SOLUTION: Freq: Once | INTRAVENOUS | Status: DC

## 2020-06-04 MED ORDER — SCOPOLAMINE 1 MG/3DAYS TD PT72
1.0000 | MEDICATED_PATCH | TRANSDERMAL | Status: DC
  Administered 2020-06-04: 1.5 mg via TRANSDERMAL

## 2020-06-04 MED ORDER — ONDANSETRON HCL 4 MG/2ML IJ SOLN
INTRAMUSCULAR | Status: DC | PRN
  Administered 2020-06-04: 4 mg via INTRAVENOUS

## 2020-06-04 MED ORDER — LIDOCAINE 2% (20 MG/ML) 5 ML SYRINGE
INTRAMUSCULAR | Status: DC | PRN
  Administered 2020-06-04: 40 mg via INTRAVENOUS

## 2020-06-04 MED ORDER — DEXAMETHASONE SODIUM PHOSPHATE 10 MG/ML IJ SOLN
INTRAMUSCULAR | Status: DC | PRN
  Administered 2020-06-04: 10 mg via INTRAVENOUS

## 2020-06-04 MED ORDER — ACETAMINOPHEN 500 MG PO TABS
1000.0000 mg | ORAL_TABLET | Freq: Once | ORAL | Status: AC
  Administered 2020-06-04: 1000 mg via ORAL
  Filled 2020-06-04: qty 2

## 2020-06-04 MED ORDER — OXYCODONE HCL 5 MG/5ML PO SOLN: 5.0000 mg | Freq: Once | ORAL | Status: DC | PRN

## 2020-06-04 MED ORDER — SCOPOLAMINE 1 MG/3DAYS TD PT72
MEDICATED_PATCH | TRANSDERMAL | Status: AC
  Filled 2020-06-04: qty 1

## 2020-06-04 MED ORDER — POVIDONE-IODINE 10 % EX SWAB: 2.0000 "application " | Freq: Once | CUTANEOUS | Status: DC

## 2020-06-04 MED ORDER — SUCCINYLCHOLINE CHLORIDE 200 MG/10ML IV SOSY
PREFILLED_SYRINGE | INTRAVENOUS | Status: DC | PRN
  Administered 2020-06-04: 140 mg via INTRAVENOUS

## 2020-06-04 SURGICAL SUPPLY — 24 items
CATH ROBINSON RED A/P 16FR (CATHETERS) ×2 IMPLANT
CLOTH BEACON ORANGE TIMEOUT ST (SAFETY) ×2 IMPLANT
COVER WAND RF STERILE (DRAPES) ×2 IMPLANT
DECANTER SPIKE VIAL GLASS SM (MISCELLANEOUS) IMPLANT
ELECT REM PT RETURN 9FT ADLT (ELECTROSURGICAL)
ELECTRODE REM PT RTRN 9FT ADLT (ELECTROSURGICAL) IMPLANT
FILTER UTR ASPR ASSEMBLY (MISCELLANEOUS) ×2 IMPLANT
GLOVE SURG LTX SZ6.5 (GLOVE) ×2 IMPLANT
GLOVE SURG UNDER POLY LF SZ6.5 (GLOVE) ×2 IMPLANT
GLOVE SURG UNDER POLY LF SZ7 (GLOVE) ×2 IMPLANT
GOWN STRL REUS W/TWL LRG LVL3 (GOWN DISPOSABLE) ×2 IMPLANT
KIT BERKELEY 1ST TRI 3/8 NO TR (MISCELLANEOUS) ×2 IMPLANT
KIT BERKELEY 1ST TRIMESTER 3/8 (MISCELLANEOUS) ×2 IMPLANT
KIT TURNOVER CYSTO (KITS) ×2 IMPLANT
NS IRRIG 500ML POUR BTL (IV SOLUTION) ×2 IMPLANT
PACK VAGINAL MINOR WOMEN LF (CUSTOM PROCEDURE TRAY) ×2 IMPLANT
PAD OB MATERNITY 4.3X12.25 (PERSONAL CARE ITEMS) ×2 IMPLANT
PAD PREP 24X48 CUFFED NSTRL (MISCELLANEOUS) ×2 IMPLANT
SET BERKELEY SUCTION TUBING (SUCTIONS) ×2 IMPLANT
TOWEL OR 17X26 10 PK STRL BLUE (TOWEL DISPOSABLE) ×2 IMPLANT
VACURETTE 10 RIGID CVD (CANNULA) ×2 IMPLANT
VACURETTE 7MM CVD STRL WRAP (CANNULA) IMPLANT
VACURETTE 8 RIGID CVD (CANNULA) IMPLANT
VACURETTE 9 RIGID CVD (CANNULA) IMPLANT

## 2020-06-04 NOTE — H&P (Signed)
45 y.o. [id number] presents for scheduled Dilation and evacuation for abnormal high BHCG without IUP with suspicion for molar pregnancy.  Pt initially presented 05/20/2020 for initial pregnancy visit.  +UPT, on [place] possible pseudosac, no IUP, RO with internal cyst with solid component, cannot r.o ovarian ectopic,  ectopic precautions given, last pregnancy was also followed for suspected ectopic.  BHCG #1: 27517, BHCG #2 [id number]. Repeat US  05/29/2020, essentially unchanged from prior scan.  Possible  hemorrhagic material within uterus, no GS, RO with cystic area possible CL, no GS/YS/FP.  Discussed options for mgmt of abn pregnancy, for strongly desired pregnancy pt requested f/u BHCG:  f/u BHCG [id number].  Advised given BHCG >100000 without IUP at risk for molar pregnancy.  Pt requested f/u US which showed cystic intrauterine contents c/w molar pregnancy. Advised we should proceed with D&E.  Pt contacted me 5/24 reports some red bleeding.  Bleeding precautions were reviewed at that time.  Past Medical History:  Diagnosis Date  . Anemia   . Anxiety   . Depression   . GERD (gastroesophageal reflux disease)   . Hiatal hernia   . History of palpitations (05-31-2020  pt denies any issues)   pt has work-up including ekg and event monitor done 07/ 2021, ekg okay and montior showed SR -- ST with rare PAC/ PVC (results in care everhwhere  . Infertility, female   . Insomnia   . Migraines   . Wears glasses    Past Surgical History:  Procedure Laterality Date  . ESOPHAGOGASTRODUODENOSCOPY  08/2019  . MICROTUBOPLASTY  2014   bilateral tubal reversal  . OVUM / OOCYTE RETRIEVAL  x2  2013  . TUBAL LIGATION Bilateral 2001  . WISDOM TOOTH EXTRACTION      Social History   Socioeconomic History  . Marital status: Single    Spouse name: Not on file  . Number of children: Not on file  . Years of education: Not on file  . Highest education level: Not on file  Occupational History  . Not on file  Tobacco Use  .  Smoking status: Never Smoker  . Smokeless tobacco: Never Used  Vaping Use  . Vaping Use: Never used  Substance and Sexual Activity  . Alcohol use: No  . Drug use: Never  . Sexual activity: Not on file  Other Topics Concern  . Not on file  Social History Narrative  . Not on file   Social Determinants of Health   Financial Resource Strain: Not on file  Food Insecurity: Not on file  Transportation Needs: Not on file  Physical Activity: Not on file  Stress: Not on file  Social Connections: Not on file  Intimate Partner Violence: Not on file    No current facility-administered medications on file prior to encounter.   Current Outpatient Medications on File Prior to Encounter  Medication Sig Dispense Refill  . Cyanocobalamin (B-12 PO) Take by mouth daily.    . eszopiclone (LUNESTA) 2 MG TABS tablet Take 2 mg by mouth at bedtime as needed for sleep. Take immediately before bedtime    . famotidine (PEPCID) 40 MG tablet Take 40 mg by mouth at bedtime.    . Prenatal Vit-Fe Fumarate-FA (PRENATAL MULTIVITAMIN) TABS tablet Take 1 tablet by mouth daily at 12 noon.    . promethazine (PHENERGAN) 12.5 MG tablet Take 12.5 mg by mouth every 6 (six) hours as needed for nausea or vomiting.    . Vitamin D, Ergocalciferol, (DRISDOL) 1.25 MG (50000 UNIT) CAPS  capsule Take 50,000 Units by mouth every 7 (seven) days.    . fluticasone (FLONASE) 50 MCG/ACT nasal spray Place 2 sprays into both nostrils daily as needed for allergies or rhinitis.    Marland Kitchen montelukast (SINGULAIR) 10 MG tablet Take 10 mg by mouth daily.      Allergies  Allergen Reactions  . Sulfa Antibiotics Nausea And Vomiting and Rash  . Zithromax [Azithromycin] Rash  . Zyrtec [Cetirizine] Rash    Vitals:   05/31/20 1323 06/04/20 0610  BP:  100/72  Pulse:  70  Resp:  15  Temp:  98.4 F (36.9 C)  TempSrc:  Oral  SpO2:  100%  Weight: 74.4 kg 75.3 kg  Height: 5\' 2"  (1.575 m) 5\' 2"  (1.575 m)   In office: Lungs: clear to  ascultation Cor:  RRR Abdomen:  soft, nontender, nondistended. Ex:  no cords, erythema Pelvic:  Deferred to OR  A: Suspected molar pregnancy for D&E   P: P: All risks, benefits and alternatives d/w patient and she desires to proceed.  -Two 18-gauge IVs requested -CBC, T&S with crossmatch for 2 UPRBC on hold -Consented pt for chromosome analysis, POC to pathology -Other routine care Allyn Kenner

## 2020-06-04 NOTE — Op Note (Signed)
06/04/2020  8:08 AM  PATIENT:  Nicole David  45 y.o. female  PRE-OPERATIVE DIAGNOSIS:  MISSED AB, possible molar pregnancy  POST-OPERATIVE DIAGNOSIS:  MISSED AB, possible molar pregnancy  PROCEDURE:  Procedure(s) with comments: DILATATION AND EVACUATION (N/A) - Wants Chromosome studies done CHROMOSOME STUDIES (N/A)  SURGEON:  Surgeon(s) and Role:    Rogue Bussing, Aaliyah Gavel, DO - Primary  ASSISTANTS: none   ANESTHESIA:   local and general  EBL:  75 mL   FINDINGS: moderate POC, normal cervix  DESCRIPTION OF PROCEDURE: Patient was taken to the operating room where anesthesia was administered and found to be adequate.  Patient was prepped and draped in normal sterile fashion in dorsal lithotomy position.  A speculum was placed in the vagina, the cervix visualized and a single tooth tenaculum used to grasp the anterior lip.  The cervix was serially dilated to 31 and a curved 10 suction curette advanced gently to the fundus.  Three passes of suction curettage were performed with moderate POC and appropriate minimal bleeding.  A final pass of sharp curettage of all 4 quadrants was performed with good uterine cry, and only small clots removed.  No further bleeding noted from the cervical os. All instruments were removed and minimal bleeding noted.  Patient was returned to dorsal supine position and tolerated the procedure well.  POC were examined and not abundant, however possible edematous villi noted in small quantity.  BLOOD ADMINISTERED:none  LOCAL MEDICATIONS USED:  LIDOCAINE  and Amount: 10 ml paracervical block  SPECIMEN:  Source of Specimen:  POC for pathology and chromosome studies  DISPOSITION OF SPECIMEN:  ANORA  COUNTS:  YES  PLAN OF CARE: Discharge to home after PACU  PATIENT DISPOSITION:  PACU - hemodynamically stable.   Delay start of Pharmacological VTE agent (>24hrs) due to surgical blood loss or risk of bleeding: not applicable

## 2020-06-04 NOTE — Discharge Instructions (Signed)
Please use up to 1000mg  every 6 hours as needed and 600mg  ibuprofen as needed for crampy pain and discomfort.  Call your doctor with any heavy persistent bleeding, fever, tenderness or other concerns.  Follow up in the office in one week for post-operative checkup.    Post Anesthesia Home Care Instructions  Activity: Get plenty of rest for the remainder of the day. A responsible individual must stay with you for 24 hours following the procedure.  For the next 24 hours, DO NOT: -Drive a car -Paediatric nurse -Drink alcoholic beverages -Take any medication unless instructed by your physician -Make any legal decisions or sign important papers.  Meals: Start with liquid foods such as gelatin or soup. Progress to regular foods as tolerated. Avoid greasy, spicy, heavy foods. If nausea and/or vomiting occur, drink only clear liquids until the nausea and/or vomiting subsides. Call your physician if vomiting continues.  Special Instructions/Symptoms: Your throat may feel dry or sore from the anesthesia or the breathing tube placed in your throat during surgery. If this causes discomfort, gargle with warm salt water. The discomfort should disappear within 24 hours.  If you had a scopolamine patch placed behind your ear for the management of post- operative nausea and/or vomiting:  1. The medication in the patch is effective for 72 hours, after which it should be removed.  Wrap patch in a tissue and discard in the trash. Wash hands thoroughly with soap and water. 2. You may remove the patch earlier than 72 hours if you experience unpleasant side effects which may include dry mouth, dizziness or visual disturbances. 3. Avoid touching the patch. Wash your hands with soap and water after contact with the patch.

## 2020-06-04 NOTE — Transfer of Care (Signed)
Immediate Anesthesia Transfer of Care Note  Patient: Nicole David  Procedure(s) Performed: DILATATION AND EVACUATION (N/A Vagina ) CHROMOSOME STUDIES (N/A Vagina )  Patient Location: PACU  Anesthesia Type:General  Level of Consciousness: awake, alert  and oriented  Airway & Oxygen Therapy: Patient Spontanous Breathing and Patient connected to nasal cannula oxygen  Post-op Assessment: Report given to RN  Post vital signs: Reviewed and stable  Last Vitals:  Vitals Value Taken Time  BP 110/59 06/04/20 0813  Temp    Pulse 87 06/04/20 0814  Resp 14 06/04/20 0814  SpO2 97 % 06/04/20 0814  Vitals shown include unvalidated device data.  Last Pain:  Vitals:   06/04/20 0610  TempSrc: Oral  PainSc: 3       Patients Stated Pain Goal: 3 (75/05/18 3358)  Complications: No complications documented.

## 2020-06-04 NOTE — Anesthesia Postprocedure Evaluation (Signed)
Anesthesia Post Note  Patient: Nicole David  Procedure(s) Performed: DILATATION AND EVACUATION (N/A Vagina ) CHROMOSOME STUDIES (N/A Vagina )     Patient location during evaluation: Phase II Anesthesia Type: General Level of consciousness: awake and alert, patient cooperative and oriented Pain management: pain level controlled Vital Signs Assessment: post-procedure vital signs reviewed and stable Respiratory status: spontaneous breathing, nonlabored ventilation and respiratory function stable Cardiovascular status: blood pressure returned to baseline and stable Postop Assessment: no apparent nausea or vomiting, adequate PO intake and able to ambulate (mild sore throat improving with beverage) Anesthetic complications: no   No complications documented.  Last Vitals:  Vitals:   06/04/20 0845 06/04/20 0900  BP: 102/67 107/63  Pulse: 96 94  Resp: 18 17  Temp:    SpO2: 100% 100%    Last Pain:  Vitals:   06/04/20 0900  TempSrc:   PainSc: 2                  Ceci Taliaferro,E. Nou Chard

## 2020-06-04 NOTE — Anesthesia Procedure Notes (Signed)
Procedure Name: Intubation Date/Time: 06/04/2020 7:35 AM Performed by: Bonney Aid, CRNA Pre-anesthesia Checklist: Patient identified, Emergency Drugs available, Suction available and Patient being monitored Patient Re-evaluated:Patient Re-evaluated prior to induction Oxygen Delivery Method: Circle system utilized Preoxygenation: Pre-oxygenation with 100% oxygen Induction Type: IV induction Ventilation: Mask ventilation without difficulty Laryngoscope Size: Mac and 3 Grade View: Grade I Tube type: Oral Tube size: 7.0 mm Number of attempts: 1 Airway Equipment and Method: Stylet Placement Confirmation: ETT inserted through vocal cords under direct vision,  positive ETCO2 and breath sounds checked- equal and bilateral Secured at: 20 cm Tube secured with: Tape Dental Injury: Teeth and Oropharynx as per pre-operative assessment

## 2020-06-05 ENCOUNTER — Encounter (HOSPITAL_BASED_OUTPATIENT_CLINIC_OR_DEPARTMENT_OTHER): Payer: Self-pay | Admitting: Obstetrics and Gynecology

## 2020-06-05 LAB — SURGICAL PATHOLOGY

## 2020-06-08 LAB — TYPE AND SCREEN
ABO/RH(D): A POS
Antibody Screen: NEGATIVE
Unit division: 0
Unit division: 0

## 2020-06-08 LAB — BPAM RBC
Blood Product Expiration Date: 202206182359
Blood Product Expiration Date: 202206182359
Unit Type and Rh: 6200
Unit Type and Rh: 6200

## 2020-06-25 ENCOUNTER — Ambulatory Visit: Payer: BC Managed Care – PPO | Attending: Family Medicine

## 2020-07-30 ENCOUNTER — Encounter (HOSPITAL_BASED_OUTPATIENT_CLINIC_OR_DEPARTMENT_OTHER): Payer: Self-pay | Admitting: Obstetrics and Gynecology

## 2020-07-30 ENCOUNTER — Other Ambulatory Visit: Payer: Self-pay

## 2020-07-30 NOTE — Progress Notes (Signed)
Spoke w/ via phone for pre-op interview---pt Lab needs dos----  t & s             Lab results------none COVID test -----patient states asymptomatic no test needed Arrive at -------630 am 08-05-2020 NPO after MN NO Solid Food.  Clear liquids from MN until---530 am then npo Med rec completed Medications to take morning of surgery -----singulair, lorazepam prn, lexapro Diabetic medication -----n/a Patient instructed no nail polish to be worn day of surgery Patient instructed to bring photo id and insurance card day of surgery Patient aware to have Driver (ride ) / caregiver  sign other Nicole David will drop pt off  for 24 hours after surgery  Patient Special Instructions -----none Pre-Op special Istructions -----none Patient verbalized understanding of instructions that were given at this phone interview. Patient denies shortness of breath, chest pain, fever, cough at this phone interview.

## 2020-08-04 NOTE — Anesthesia Preprocedure Evaluation (Addendum)
Anesthesia Evaluation  Patient identified by MRN, date of birth, ID band Patient awake    Reviewed: Allergy & Precautions, NPO status , Patient's Chart, lab work & pertinent test results  Airway Mallampati: II  TM Distance: >3 FB Neck ROM: Full    Dental no notable dental hx. (+) Teeth Intact, Dental Advisory Given   Pulmonary neg pulmonary ROS,    Pulmonary exam normal breath sounds clear to auscultation       Cardiovascular Normal cardiovascular exam Rhythm:Regular Rate:Normal     Neuro/Psych  Headaches, Anxiety Depression    GI/Hepatic Neg liver ROS, GERD  ,  Endo/Other  negative endocrine ROS  Renal/GU negative Renal ROS     Musculoskeletal negative musculoskeletal ROS (+)   Abdominal (+) + obese (BMI 31.83),   Peds  Hematology negative hematology ROS (+) Lab Results      Component                Value               Date                      WBC                      7.3                 08/05/2020                HGB                      13.1                08/05/2020                HCT                      37.7                08/05/2020                MCV                      96.4                08/05/2020                PLT                      178                 08/05/2020              Anesthesia Other Findings All: Sulfa, Zithromax, Zyrtec  Reproductive/Obstetrics negative OB ROS                            Anesthesia Physical Anesthesia Plan  ASA: 2  Anesthesia Plan: General   Post-op Pain Management:    Induction: Intravenous  PONV Risk Score and Plan: 4 or greater and Midazolam, Treatment may vary due to age or medical condition, Dexamethasone and Ondansetron  Airway Management Planned: LMA  Additional Equipment: None  Intra-op Plan:   Post-operative Plan:   Informed Consent:     Dental advisory given  Plan Discussed with: CRNA and  Anesthesiologist  Anesthesia Plan Comments:  Anesthesia Quick Evaluation  

## 2020-08-05 ENCOUNTER — Encounter (HOSPITAL_BASED_OUTPATIENT_CLINIC_OR_DEPARTMENT_OTHER): Payer: Self-pay | Admitting: Obstetrics and Gynecology

## 2020-08-05 ENCOUNTER — Ambulatory Visit (HOSPITAL_BASED_OUTPATIENT_CLINIC_OR_DEPARTMENT_OTHER): Payer: BC Managed Care – PPO | Admitting: Anesthesiology

## 2020-08-05 ENCOUNTER — Encounter (HOSPITAL_BASED_OUTPATIENT_CLINIC_OR_DEPARTMENT_OTHER)
Admission: RE | Disposition: A | Discharge status: Home / Self Care | Payer: Self-pay | Source: Home / Self Care | Attending: Obstetrics and Gynecology

## 2020-08-05 ENCOUNTER — Ambulatory Visit (HOSPITAL_BASED_OUTPATIENT_CLINIC_OR_DEPARTMENT_OTHER)
Admission: RE | Admit: 2020-08-05 | Discharge: 2020-08-05 | Disposition: A | Discharge status: Home / Self Care | Payer: BC Managed Care – PPO | Attending: Obstetrics and Gynecology | Admitting: Obstetrics and Gynecology

## 2020-08-05 DIAGNOSIS — N858 Other specified noninflammatory disorders of uterus: Secondary | ICD-10-CM | POA: Diagnosis present

## 2020-08-05 DIAGNOSIS — Z79899 Other long term (current) drug therapy: Secondary | ICD-10-CM | POA: Insufficient documentation

## 2020-08-05 DIAGNOSIS — Z881 Allergy status to other antibiotic agents status: Secondary | ICD-10-CM | POA: Insufficient documentation

## 2020-08-05 DIAGNOSIS — Z882 Allergy status to sulfonamides status: Secondary | ICD-10-CM | POA: Diagnosis not present

## 2020-08-05 HISTORY — PX: DILATION AND EVACUATION: SHX1459

## 2020-08-05 LAB — CBC
HCT: 37.7 % (ref 36.0–46.0)
Hemoglobin: 13.1 g/dL (ref 12.0–15.0)
MCH: 33.5 pg (ref 26.0–34.0)
MCHC: 34.7 g/dL (ref 30.0–36.0)
MCV: 96.4 fL (ref 80.0–100.0)
Platelets: 178 10*3/uL (ref 150–400)
RBC: 3.91 MIL/uL (ref 3.87–5.11)
RDW: 13.6 % (ref 11.5–15.5)
WBC: 7.3 10*3/uL (ref 4.0–10.5)
nRBC: 0 % (ref 0.0–0.2)

## 2020-08-05 LAB — TYPE AND SCREEN
ABO/RH(D): A POS
Antibody Screen: NEGATIVE

## 2020-08-05 SURGERY — DILATION AND EVACUATION, UTERUS
Anesthesia: General

## 2020-08-05 MED ORDER — PROPOFOL 10 MG/ML IV BOLUS
INTRAVENOUS | Status: DC | PRN
  Administered 2020-08-05: 200 mg via INTRAVENOUS

## 2020-08-05 MED ORDER — LACTATED RINGERS IV SOLN: INTRAVENOUS | Status: DC

## 2020-08-05 MED ORDER — OXYCODONE HCL 5 MG PO TABS: 5.0000 mg | ORAL_TABLET | Freq: Once | ORAL | Status: DC | PRN

## 2020-08-05 MED ORDER — HYDROMORPHONE HCL 1 MG/ML IJ SOLN
0.2500 mg | INTRAMUSCULAR | Status: DC | PRN
  Administered 2020-08-05: 0.5 mg via INTRAVENOUS

## 2020-08-05 MED ORDER — LIDOCAINE 2% (20 MG/ML) 5 ML SYRINGE
INTRAMUSCULAR | Status: DC | PRN
  Administered 2020-08-05: 100 mg via INTRAVENOUS

## 2020-08-05 MED ORDER — ONDANSETRON HCL 4 MG/2ML IJ SOLN
INTRAMUSCULAR | Status: DC | PRN
  Administered 2020-08-05: 4 mg via INTRAVENOUS

## 2020-08-05 MED ORDER — LIDOCAINE HCL (PF) 2 % IJ SOLN
INTRAMUSCULAR | Status: AC
  Filled 2020-08-05: qty 5

## 2020-08-05 MED ORDER — OXYCODONE HCL 5 MG/5ML PO SOLN: 5.0000 mg | Freq: Once | ORAL | Status: DC | PRN

## 2020-08-05 MED ORDER — ONDANSETRON HCL 8 MG PO TABS: 8.0000 mg | ORAL_TABLET | Freq: Three times a day (TID) | ORAL | 0 refills | Status: DC | PRN

## 2020-08-05 MED ORDER — HYDROMORPHONE HCL 1 MG/ML IJ SOLN
INTRAMUSCULAR | Status: AC
  Filled 2020-08-05: qty 1

## 2020-08-05 MED ORDER — OXYCODONE HCL 5 MG PO TABS: 5.0000 mg | ORAL_TABLET | ORAL | Status: DC | PRN

## 2020-08-05 MED ORDER — ONDANSETRON HCL 4 MG/2ML IJ SOLN
INTRAMUSCULAR | Status: AC
  Filled 2020-08-05: qty 2

## 2020-08-05 MED ORDER — MIDAZOLAM HCL 2 MG/2ML IJ SOLN
INTRAMUSCULAR | Status: AC
  Filled 2020-08-05: qty 2

## 2020-08-05 MED ORDER — DEXAMETHASONE SODIUM PHOSPHATE 10 MG/ML IJ SOLN
INTRAMUSCULAR | Status: AC
  Filled 2020-08-05: qty 1

## 2020-08-05 MED ORDER — FENTANYL CITRATE (PF) 100 MCG/2ML IJ SOLN
INTRAMUSCULAR | Status: AC
  Filled 2020-08-05: qty 2

## 2020-08-05 MED ORDER — ONDANSETRON HCL 4 MG/2ML IJ SOLN: 4.0000 mg | Freq: Once | INTRAMUSCULAR | Status: DC | PRN

## 2020-08-05 MED ORDER — LIDOCAINE HCL 1 % IJ SOLN
INTRAMUSCULAR | Status: DC | PRN
  Administered 2020-08-05: 10 mL

## 2020-08-05 MED ORDER — PROPOFOL 10 MG/ML IV BOLUS
INTRAVENOUS | Status: AC
  Filled 2020-08-05: qty 20

## 2020-08-05 MED ORDER — FENTANYL CITRATE (PF) 100 MCG/2ML IJ SOLN
INTRAMUSCULAR | Status: DC | PRN
  Administered 2020-08-05: 50 ug via INTRAVENOUS

## 2020-08-05 MED ORDER — MIDAZOLAM HCL 5 MG/5ML IJ SOLN
INTRAMUSCULAR | Status: DC | PRN
  Administered 2020-08-05: 2 mg via INTRAVENOUS

## 2020-08-05 MED ORDER — DEXAMETHASONE SODIUM PHOSPHATE 10 MG/ML IJ SOLN
INTRAMUSCULAR | Status: DC | PRN
  Administered 2020-08-05: 10 mg via INTRAVENOUS

## 2020-08-05 MED ORDER — ONDANSETRON HCL 4 MG PO TABS: 8.0000 mg | ORAL_TABLET | Freq: Three times a day (TID) | ORAL | Status: DC | PRN

## 2020-08-05 MED ORDER — POVIDONE-IODINE 10 % EX SWAB: 2.0000 "application " | Freq: Once | CUTANEOUS | Status: DC

## 2020-08-05 MED ORDER — KETOROLAC TROMETHAMINE 30 MG/ML IJ SOLN: 30.0000 mg | Freq: Once | INTRAMUSCULAR | Status: DC | PRN

## 2020-08-05 MED ORDER — OXYCODONE HCL 5 MG PO TABS: 5.0000 mg | ORAL_TABLET | ORAL | 0 refills | Status: DC | PRN

## 2020-08-05 SURGICAL SUPPLY — 20 items
CATH ROBINSON RED A/P 16FR (CATHETERS) ×2 IMPLANT
FILTER UTR ASPR ASSEMBLY (MISCELLANEOUS) ×2 IMPLANT
GAUZE 4X4 16PLY ~~LOC~~+RFID DBL (SPONGE) ×2 IMPLANT
GLOVE SURG ENC MOIS LTX SZ6.5 (GLOVE) ×2 IMPLANT
GLOVE SURG UNDER POLY LF SZ6.5 (GLOVE) ×2 IMPLANT
GLOVE SURG UNDER POLY LF SZ7 (GLOVE) ×2 IMPLANT
GOWN STRL REUS W/TWL LRG LVL3 (GOWN DISPOSABLE) ×4 IMPLANT
KIT BERKELEY 1ST TRI 3/8 NO TR (MISCELLANEOUS) ×2 IMPLANT
KIT BERKELEY 1ST TRIMESTER 3/8 (MISCELLANEOUS) ×2 IMPLANT
KIT TURNOVER CYSTO (KITS) ×2 IMPLANT
NS IRRIG 500ML POUR BTL (IV SOLUTION) ×2 IMPLANT
PACK VAGINAL MINOR WOMEN LF (CUSTOM PROCEDURE TRAY) ×2 IMPLANT
PAD OB MATERNITY 4.3X12.25 (PERSONAL CARE ITEMS) ×2 IMPLANT
PAD PREP 24X48 CUFFED NSTRL (MISCELLANEOUS) ×2 IMPLANT
SET BERKELEY SUCTION TUBING (SUCTIONS) ×2 IMPLANT
TOWEL OR 17X26 10 PK STRL BLUE (TOWEL DISPOSABLE) ×2 IMPLANT
VACURETTE 10 RIGID CVD (CANNULA) IMPLANT
VACURETTE 7MM CVD STRL WRAP (CANNULA) IMPLANT
VACURETTE 8 RIGID CVD (CANNULA) IMPLANT
VACURETTE 9 RIGID CVD (CANNULA) IMPLANT

## 2020-08-05 NOTE — Discharge Instructions (Addendum)
DISCHARGE INSTRUCTIONS: D&C / D&E The following instructions have been prepared to help you care for yourself upon your return home.   Personal hygiene:  Use sanitary pads for vaginal drainage, not tampons.  Shower the day after your procedure.  NO tub baths, pools or Jacuzzis for 2-3 weeks.  Wipe front to back after using the bathroom.  Activity and limitations:  Do NOT drive or operate any equipment for 24 hours. The effects of anesthesia are still present and drowsiness may result.  Do NOT rest in bed all day.  Walking is encouraged.  Walk up and down stairs slowly.  You may resume your normal activity in one to two days or as indicated by your physician.  Sexual activity: NO intercourse for at least 2 weeks after the procedure, or as indicated by your physician.  Diet: Eat a light meal as desired this evening. You may resume your usual diet tomorrow.  Return to work: You may resume your work activities in one to two days or as indicated by your doctor.  What to expect after your surgery: Expect to have vaginal bleeding/discharge for 2-3 days and spotting for up to 10 days. It is not unusual to have soreness for up to 1-2 weeks. You may have a slight burning sensation when you urinate for the first day. Mild cramps may continue for a couple of days. You may have a regular period in 2-6 weeks.  Call your doctor for any of the following:  Excessive vaginal bleeding, saturating and changing one pad every hour.  Inability to urinate 6 hours after discharge from hospital.  Pain not relieved by pain medication.  Fever of 100.4 F or greater.  Unusual vaginal discharge or odor.   Call for an appointment:      Post Anesthesia Home Care Instructions  Activity: Get plenty of rest for the remainder of the day. A responsible adult should stay with you for 24 hours following the procedure.  For the next 24 hours, DO NOT: -Drive a car -Operate machinery -Drink alcoholic  beverages -Take any medication unless instructed by your physician -Make any legal decisions or sign important papers.  Meals: Start with liquid foods such as gelatin or soup. Progress to regular foods as tolerated. Avoid greasy, spicy, heavy foods. If nausea and/or vomiting occur, drink only clear liquids until the nausea and/or vomiting subsides. Call your physician if vomiting continues.  Special Instructions/Symptoms: Your throat may feel dry or sore from the anesthesia or the breathing tube placed in your throat during surgery. If this causes discomfort, gargle with warm salt water. The discomfort should disappear within 24 hours.   

## 2020-08-05 NOTE — Brief Op Note (Signed)
08/05/2020  8:16 AM  PATIENT:  Nicole David  45 y.o. female  PRE-OPERATIVE DIAGNOSIS:  Gestational trophoblastic neoplasia  POST-OPERATIVE DIAGNOSIS:  Gestational trophoblastic neoplasia  PROCEDURE:  Procedure(s): DILATATION AND EVACUATION (N/A)  SURGEON:  Surgeon(s) and Role:    Rogue Bussing, Casimer Bilis, DO - Primary  ANESTHESIA:   general  EBL:  minimal  DESCRIPTION OF PROCEDURE: Patient was taken to the operating room where anesthesia was administered and found to be adequate.  Patient was prepped and draped in normal sterile fashion in dorsal lithotomy position.  A speculum was placed in the vagina, the cervix visualized and a single tooth tenaculum used to grasp the anterior lip.  The cervix was serially dilated to 27 pratt and a curved 8 suction curette advanced gently to the fundus.  Three passes of suction curettage were performed with minimal returned material and appropriate minimal bleeding.   A pass of sharp curettage of all 4 quadrants was performed with good uterine cry, and only small amount of material removed.  A final pass of the suction curette was performed. No further bleeding noted from the cervical os. All instruments were removed and minimal bleeding noted.  Patient was returned to dorsal supine position and tolerated the procedure well.  POC were examined and not abundant.  LOCAL MEDICATIONS USED:  LIDOCAINE  and Amount: 10 ml paracervically  SPECIMEN:  Source of Specimen:  uterine contents  DISPOSITION OF SPECIMEN:  PATHOLOGY  COUNTS:  YES  PLAN OF CARE: Discharge to home after PACU  PATIENT DISPOSITION:  PACU - hemodynamically stable.   Delay start of Pharmacological VTE agent (>24hrs) due to surgical blood loss or risk of bleeding: not applicable

## 2020-08-05 NOTE — Transfer of Care (Signed)
Immediate Anesthesia Transfer of Care Note  Patient: Nicole David  Procedure(s) Performed: DILATATION AND EVACUATION  Patient Location: PACU  Anesthesia Type:General  Level of Consciousness: awake, alert , oriented and patient cooperative  Airway & Oxygen Therapy: Patient Spontanous Breathing  Post-op Assessment: Report given to RN and Post -op Vital signs reviewed and stable  Post vital signs: Reviewed and stable  Last Vitals:  Vitals Value Taken Time  BP 106/59 08/05/20 0852  Temp    Pulse 80 08/05/20 0853  Resp 14 08/05/20 0853  SpO2 97 % 08/05/20 0853  Vitals shown include unvalidated device data.  Last Pain:  Vitals:   08/05/20 0707  TempSrc: Oral  PainSc: 4       Patients Stated Pain Goal: 5 ([id number])  Complications: No notable events documented.

## 2020-08-05 NOTE — Anesthesia Procedure Notes (Signed)
Procedure Name: LMA Insertion Date/Time: 08/05/2020 8:23 AM Performed by: Rogers Blocker, CRNA Pre-anesthesia Checklist: Patient identified, Emergency Drugs available, Suction available and Patient being monitored Patient Re-evaluated:Patient Re-evaluated prior to induction Oxygen Delivery Method: Circle System Utilized Preoxygenation: Pre-oxygenation with 100% oxygen Induction Type: IV induction Ventilation: Mask ventilation without difficulty LMA: LMA inserted LMA Size: 3.0 Number of attempts: 1 Placement Confirmation: positive ETCO2 Tube secured with: Tape Dental Injury: Teeth and Oropharynx as per pre-operative assessment

## 2020-08-05 NOTE — H&P (Signed)
45 y.o. [id number] presents for repeat D&E after pt underwent initial D&E for molar pregnancy then had follow up surveillance showing initial decline of BHCG, then plateau and rise.  Patient now meets criteria of GTN.  Case was discussed with Gyn Onc who recommends repeat D&E followed by ongoing BHCG surveillance.  Past Medical History:  Diagnosis Date   Anemia    Anxiety    Depression    GERD (gastroesophageal reflux disease)    Hiatal hernia    History of palpitations (05-31-2020  pt denies any issues)   pt has work-up including ekg and event monitor done 07/ 2021, ekg okay and montior showed SR -- ST with rare PAC/ PVC (results in care everhwhere   Infertility, female    Insomnia    Migraines    Wears glasses    Past Surgical History:  Procedure Laterality Date   DILATION AND EVACUATION N/A 06/04/2020   Procedure: DILATATION AND EVACUATION;  Surgeon: Allyn Kenner, DO;  Location: [facility];  Service: Gynecology;  Laterality: N/A;  Wants Chromosome studies done   ESOPHAGOGASTRODUODENOSCOPY  08/2019   MICROTUBOPLASTY  2014   bilateral tubal reversal   OVUM / OOCYTE RETRIEVAL  x2  2013   TUBAL LIGATION Bilateral 2001   WISDOM TOOTH EXTRACTION      Social History   Socioeconomic History   Marital status: Single    Spouse name: Not on file   Number of children: Not on file   Years of education: Not on file   Highest education level: Not on file  Occupational History   Not on file  Tobacco Use   Smoking status: Never   Smokeless tobacco: Never  Vaping Use   Vaping Use: Never used  Substance and Sexual Activity   Alcohol use: No   Drug use: Never   Sexual activity: Not on file  Other Topics Concern   Not on file  Social History Narrative   Not on file   Social Determinants of Health   Financial Resource Strain: Not on file  Food Insecurity: Not on file  Transportation Needs: Not on file  Physical Activity: Not on file  Stress: Not on file  Social  Connections: Not on file  Intimate Partner Violence: Not on file    No current facility-administered medications on file prior to encounter.   Current Outpatient Medications on File Prior to Encounter  Medication Sig Dispense Refill   escitalopram (LEXAPRO) 20 MG tablet Take 20 mg by mouth daily.     eszopiclone (LUNESTA) 2 MG TABS tablet Take 2 mg by mouth at bedtime as needed for sleep. Take immediately before bedtime     famotidine (PEPCID) 40 MG tablet Take 40 mg by mouth at bedtime.     LORazepam (ATIVAN) 0.5 MG tablet Take 0.5 mg by mouth every 8 (eight) hours.     montelukast (SINGULAIR) 10 MG tablet Take 10 mg by mouth daily.     promethazine (PHENERGAN) 12.5 MG tablet Take 12.5 mg by mouth every 6 (six) hours as needed for nausea or vomiting.     Cyanocobalamin (B-12 PO) Take by mouth daily. (Patient not taking: Reported on 07/30/2020)     fluticasone (FLONASE) 50 MCG/ACT nasal spray Place 2 sprays into both nostrils daily as needed for allergies or rhinitis.     Prenatal Vit-Fe Fumarate-FA (PRENATAL MULTIVITAMIN) TABS tablet Take 1 tablet by mouth daily at 12 noon.     Vitamin D, Ergocalciferol, (DRISDOL) 1.25 MG (50000 UNIT)  CAPS capsule Take 50,000 Units by mouth every 7 (seven) days. thursday      Allergies  Allergen Reactions   Sulfa Antibiotics Nausea And Vomiting and Rash   Zithromax [Azithromycin] Rash   Zyrtec [Cetirizine] Rash    Vitals:   07/30/20 1144  Weight: 72.6 kg  Height: '5\' 2"'$  (1.575 m)   PE: pending Pelvic:  deferred to OR  A:  Admitted for D&E for GTN   P: All risks, benefits and alternatives d/w patient and she desires to proceed.   Abx not needed Routine pre-op care Allyn Kenner

## 2020-08-05 NOTE — Anesthesia Postprocedure Evaluation (Signed)
Anesthesia Post Note  Patient: Nicole David  Procedure(s) Performed: DILATATION AND EVACUATION     Patient location during evaluation: PACU Anesthesia Type: General Level of consciousness: awake and alert Pain management: pain level controlled Vital Signs Assessment: post-procedure vital signs reviewed and stable Respiratory status: spontaneous breathing, nonlabored ventilation, respiratory function stable and patient connected to nasal cannula oxygen Cardiovascular status: blood pressure returned to baseline and stable Postop Assessment: no apparent nausea or vomiting Anesthetic complications: no   No notable events documented.  Last Vitals:  Vitals:   08/05/20 0928 08/05/20 1001  BP:  115/75  Pulse: 78 72  Resp: 14 14  Temp: 36.7 C 36.7 C  SpO2: 94% 95%    Last Pain:  Vitals:   08/05/20 1001  TempSrc:   PainSc: 0-No pain                 Barnet Glasgow

## 2020-08-06 ENCOUNTER — Encounter (HOSPITAL_BASED_OUTPATIENT_CLINIC_OR_DEPARTMENT_OTHER): Payer: Self-pay | Admitting: Obstetrics and Gynecology

## 2020-08-07 NOTE — Op Note (Signed)
08/05/2020  8:16 AM  PATIENT:  Nicole David  45 y.o. female  PRE-OPERATIVE DIAGNOSIS:  Gestational trophoblastic neoplasia  POST-OPERATIVE DIAGNOSIS:  Gestational trophoblastic neoplasia  PROCEDURE:  Procedure(s): DILATATION AND EVACUATION (N/A)  SURGEON:  Surgeon(s) and Role:    Rogue Bussing, Casimer Bilis, DO - Primary  ANESTHESIA:   general  EBL:  minimal  DESCRIPTION OF PROCEDURE: Patient was taken to the operating room where anesthesia was administered and found to be adequate.  Patient was prepped and draped in normal sterile fashion in dorsal lithotomy position.  A speculum was placed in the vagina, the cervix visualized and a single tooth tenaculum used to grasp the anterior lip.  The cervix was serially dilated to 27 pratt and a curved 8 suction curette advanced gently to the fundus.  Three passes of suction curettage were performed with minimal returned material and appropriate minimal bleeding.   A pass of sharp curettage of all 4 quadrants was performed with good uterine cry, and only small amount of material removed.  A final pass of the suction curette was performed. No further bleeding noted from the cervical os. All instruments were removed and minimal bleeding noted.  Patient was returned to dorsal supine position and tolerated the procedure well.  POC were examined and not abundant.  LOCAL MEDICATIONS USED:  LIDOCAINE  and Amount: 10 ml paracervically  SPECIMEN:  Source of Specimen:  uterine contents  DISPOSITION OF SPECIMEN:  PATHOLOGY  COUNTS:  YES  PLAN OF CARE: Discharge to home after PACU  PATIENT DISPOSITION:  PACU - hemodynamically stable.   Delay start of Pharmacological VTE agent (>24hrs) due to surgical blood loss or risk of bleeding: not applicable

## 2020-08-08 LAB — SURGICAL PATHOLOGY

## 2020-11-14 ENCOUNTER — Ambulatory Visit: Payer: BC Managed Care – PPO

## 2020-11-14 ENCOUNTER — Other Ambulatory Visit: Payer: Self-pay

## 2020-11-14 ENCOUNTER — Ambulatory Visit: Payer: BC Managed Care – PPO | Attending: Family Medicine

## 2020-11-14 ENCOUNTER — Ambulatory Visit: Payer: BC Managed Care – PPO | Admitting: *Deleted

## 2020-11-14 DIAGNOSIS — Z8759 Personal history of other complications of pregnancy, childbirth and the puerperium: Secondary | ICD-10-CM | POA: Diagnosis not present

## 2020-11-14 DIAGNOSIS — O09A1 Supervision of pregnancy with history of molar pregnancy, first trimester: Secondary | ICD-10-CM

## 2020-11-14 NOTE — Progress Notes (Signed)
  Name: Nicole David Indication: Discuss previous complete molar pregnancy  DOB: 06/23/1975 Age: 45 y.o.   EDC: Patient not currently pregnant LMP: 11/14/2020 Referring Provider:  Allyn Kenner, DO  EGA: Patient not currently pregnant Genetic Counselor: Nicole Righter, MS, CGC  OB Hx: [id number] Date of Appointment: 11/14/2020  Accompanied by: Unaccompanied Face to Face Time: 23 Minutes   Medical History:  Nicole David reports she has been pregnant a total of 6 times. She currently has two living children. She had a daughter pass away at age 57 (cause of death unknown). She had a stillborn son. 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 Reports she takes prenatal vitamins, vitamin B12, and vitamin D. Denies personal history of diabetes, high blood pressure, thyroid conditions, and seizures.   Family History: A pedigree was created and scanned into Epic under the Media tab. Nicole David reports her maternal first cousin was born with spina bifida. This individual is reported to otherwise be healthy. Nicole David reports she is taking prenatal vitamins that contain folic acid.  Maternal ethnicity reported as Caucasian and paternal ethnicity reported as [place]. Denies Ashkenazi Jewish ancestry. Family history not remarkable for consanguinity, intellectual disability, autism spectrum disorder, or unexplained neonatal death.     Genetic Counseling:   Complete Molar Pregnancy (hydatidiform mole). Hydatidiform moles occur in approximately every 1 in 1,000 pregnancies. Hydatidiform moles are classified into two types (complete or partial). In a complete molar pregnancy, the placental tissue is abnormal and swollen and appears to form fluid-filled cysts. There is no formation of fetal tissue. A molar pregnancy is caused by an abnormally fertilized egg. Human cells normally contain 23 pairs of chromosomes. One chromosome in each pair comes from  the father, the other from the mother. In a complete molar pregnancy, an empty egg is fertilized by one or two sperm, and all of the genetic material is from the father. In this situation the chromosomes from the mother's egg are lost or inactivated and the father's chromosomes are duplicated. 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   Testing/Screening Options:   Carrier screening. Per the ACOG Committee Opinion 691, all women who are considering a pregnancy or are currently pregnant should be offered carrier screening for, at minimum, Cystic Fibrosis (CF), Spinal Muscular Atrophy (SMA), and Hemoglobinopathies. The mode of inheritance, clinical manifestations of these conditions, as well as details about testing were reviewed. A negative result on carrier screening reduces the likelihood of being a carrier, however, does not entirely rule out the possibility. If Nicole David was found to be a carrier for a specific condition, carrier screening for their reproductive partner would be recommended.    Patient Plan:  Proceed with: Carrier screening for CF, SMA, Alpha Thalassemia, and Beta Hemoglobinopathies.  Informed consent was obtained. All questions were answered.   Thank you for sharing in the care of Nicole David with [place].  Please do not hesitate to contact us if you have any questions.  Nicole Righter, MS, [facility]

## 2020-11-25 ENCOUNTER — Telehealth: Payer: Self-pay | Admitting: Genetics

## 2020-11-25 NOTE — Telephone Encounter (Signed)
Called Nicole David to return carrier screening results. Could not leave a voicemail because her mailbox is full. The carrier screening results are screen negative.

## 2020-12-24 ENCOUNTER — Other Ambulatory Visit: Payer: Self-pay

## 2020-12-24 ENCOUNTER — Ambulatory Visit: Payer: Self-pay

## 2021-01-27 ENCOUNTER — Telehealth: Payer: Self-pay | Admitting: Genetics

## 2021-01-27 NOTE — Telephone Encounter (Signed)
Nicole David was contacted by telephone to review their Cystic Fibrosis (CF), Spinal Muscular Atrophy (SMA), alpha thalassemia, and beta hemoglobinopathies carrier screening results. The results are screen negative. A negative result on carrier screening reduces the likelihood of being a carrier, however, does not entirely rule out the possibility. All questions answered.

## 2021-08-20 ENCOUNTER — Ambulatory Visit: Payer: Commercial Managed Care - HMO | Admitting: Family

## 2021-08-20 ENCOUNTER — Encounter: Payer: Self-pay | Admitting: Family

## 2021-08-20 VITALS — BP 131/82 | HR 68 | Temp 98.1°F | Ht 62.0 in | Wt 185.1 lb

## 2021-08-20 DIAGNOSIS — J309 Allergic rhinitis, unspecified: Secondary | ICD-10-CM | POA: Insufficient documentation

## 2021-08-20 DIAGNOSIS — K449 Diaphragmatic hernia without obstruction or gangrene: Secondary | ICD-10-CM | POA: Diagnosis not present

## 2021-08-20 DIAGNOSIS — F5101 Primary insomnia: Secondary | ICD-10-CM | POA: Diagnosis not present

## 2021-08-20 DIAGNOSIS — J3089 Other allergic rhinitis: Secondary | ICD-10-CM | POA: Diagnosis not present

## 2021-08-20 DIAGNOSIS — K219 Gastro-esophageal reflux disease without esophagitis: Secondary | ICD-10-CM | POA: Insufficient documentation

## 2021-08-20 DIAGNOSIS — M67431 Ganglion, right wrist: Secondary | ICD-10-CM

## 2021-08-20 DIAGNOSIS — M5030 Other cervical disc degeneration, unspecified cervical region: Secondary | ICD-10-CM

## 2021-08-20 DIAGNOSIS — M5136 Other intervertebral disc degeneration, lumbar region: Secondary | ICD-10-CM

## 2021-08-20 MED ORDER — OMEPRAZOLE 40 MG PO CPDR: 40.0000 mg | DELAYED_RELEASE_CAPSULE | Freq: Every day | ORAL | 1 refills | Status: DC

## 2021-08-20 MED ORDER — FLUTICASONE PROPIONATE 50 MCG/ACT NA SUSP: NASAL | 5 refills | Status: DC

## 2021-08-20 MED ORDER — FAMOTIDINE 40 MG PO TABS: 40.0000 mg | ORAL_TABLET | Freq: Every day | ORAL | 1 refills | Status: DC

## 2021-08-20 MED ORDER — ESZOPICLONE 2 MG PO TABS: ORAL_TABLET | ORAL | 2 refills | Status: DC

## 2021-08-20 MED ORDER — CYCLOBENZAPRINE HCL 10 MG PO TABS: 10.0000 mg | ORAL_TABLET | Freq: Three times a day (TID) | ORAL | 2 refills | Status: DC | PRN

## 2021-08-20 NOTE — Assessment & Plan Note (Signed)
   chronic  stable with Flexeril '10mg'$  tid  refilling today  f/u 6 mos

## 2021-08-20 NOTE — Assessment & Plan Note (Signed)
   chronic  stable on Pepcid '40mg'$  & Prilosec '40mg'$  qd  refilling meds today  f/u in 6 mos

## 2021-08-20 NOTE — Progress Notes (Signed)
New Patient Office Visit  Subjective:  Patient ID: Nicole David, female    DOB: January 24, 1975  Age: 46 y.o. MRN: [id number]  CC:  Chief Complaint  Patient presents with   Establish Care   Hand Pain    Pt c/o right wrist/thumb pain, Present for 2 weeks and radiates sharp pains. Pt has not tried anything for the pain.    Gastroesophageal Reflux   Osteoarthritis   Insomnia   Allergic Rhinitis     HPI Nicole David presents for establishing care today.  HPI: Right wrist pain:  cyst on top of wrist, feels sharp, shooting pain, denies any numbness or tingling. 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  Chronic DDD in back:  Has had for years in cervical and lumbar regions. Pt takes Flexeril qd with relief. Denies any paresthesias.   GERD r/t hiatal hernia:  reports having for years, sx controlled with taking Pepcid and Omeprazole qd. Denies dysphagia or nausea. Requesting refills today. Allergic rhinitis:  environmental allergies, denies mold, pet dander, or any food allergies. Controlled with Flonase and Claritin qd. Requesting refills today.   Assessment & Plan:   zzProblem List Items Addressed This Visit       Respiratory   Allergic rhinitis    chronic stable with Flonase and claritin qd refilling Flonase today f/u in 6 mos      Relevant Medications   loratadine (CLARITIN) 10 MG tablet   fluticasone (FLONASE) 50 MCG/ACT nasal spray   Hiatal hernia with gastroesophageal reflux    chronic stable on Pepcid '40mg'$  & Prilosec '40mg'$  qd refilling meds today f/u in 6 mos      Relevant Medications   omeprazole (PRILOSEC) 40 MG capsule   famotidine (PEPCID) 40 MG tablet     Musculoskeletal and Integument   DDD (degenerative disc disease), cervical    chronic stable with Flexeril '10mg'$  tid refilling today f/u 6 mos      Relevant Medications    cyclobenzaprine (FLEXERIL) 10 MG tablet   DDD (degenerative disc disease), lumbar    chronic stable with Flexeril '10mg'$  tid refilling today f/u 6 mos      Relevant Medications   cyclobenzaprine (FLEXERIL) 10 MG tablet     Other   Insomnia - Primary    chronic stable on Lunesta '2mg'$  qhs refilling today f/u in 6 mos      Relevant Medications   eszopiclone (LUNESTA) 2 MG TABS tablet   Other Visit Diagnoses     Ganglion cyst of wrist, right    - dorsal, medial location, approx. 1.5cm in diameter, raised, tender. Referring to sports med, advised to apply ice for 48mn tid.    Relevant Orders   Ambulatory referral to Sports Medicine      Subjective:    Outpatient Medications Prior to Visit  Medication Sig Dispense Refill   Cholecalciferol (VITAMIN D3) 1.25 MG (50000 UT) CAPS Take 1 capsule by mouth once a week.     escitalopram (LEXAPRO) 20 MG tablet Take 20 mg by mouth daily.     loratadine (CLARITIN) 10 MG tablet Take by mouth.     LORazepam (ATIVAN) 0.5 MG tablet Take by mouth.     montelukast (SINGULAIR) 10 MG tablet Take 10 mg by mouth daily.     Prenatal Vit-Iron Carbonyl-FA (THRIVITE RX) 29-1 MG TABS Take 1 tablet by  mouth daily.     propranolol ER (INDERAL LA) 60 MG 24 hr capsule Take by mouth.     cyclobenzaprine (FLEXERIL) 10 MG tablet Take 10 mg by mouth 3 (three) times daily.     eszopiclone (LUNESTA) 2 MG TABS tablet TAKE 1 TABLET BY MOUTH IMMEDIATELY BEFORE BEDTIME AS NEEDED     famotidine (PEPCID) 40 MG tablet      fluticasone (FLONASE) 50 MCG/ACT nasal spray SHAKE LIQUID AND USE 2 SPRAYS IN EACH NOSTRIL EVERY DAY     omeprazole (PRILOSEC) 40 MG capsule Take 40 mg by mouth daily.     ondansetron (ZOFRAN) 8 MG tablet Take 1 tablet (8 mg total) by mouth every 8 (eight) hours as needed for nausea or vomiting. 20 tablet 0   oxyCODONE (OXY IR/ROXICODONE) 5 MG immediate release tablet Take 1 tablet (5 mg total) by mouth every 4 (four) hours as needed for moderate  pain. 20 tablet 0   No facility-administered medications prior to visit.   Past Medical History:  Diagnosis Date   Anemia    Anxiety    Depression    GERD (gastroesophageal reflux disease)    Hiatal hernia    History of palpitations (05-31-2020  pt denies any issues)   pt has work-up including ekg and event monitor done 07/ 2021, ekg okay and montior showed SR -- ST with rare PAC/ PVC (results in care everhwhere   Infertility, female    Insomnia    Migraines    Wears glasses    Past Surgical History:  Procedure Laterality Date   DILATION AND EVACUATION N/A 06/04/2020   Procedure: DILATATION AND EVACUATION;  Surgeon: Allyn Kenner, DO;  Location: [place];  Service: Gynecology;  Laterality: N/A;  Wants Chromosome studies done   DILATION AND EVACUATION N/A 08/05/2020   Procedure: DILATATION AND EVACUATION;  Surgeon: Allyn Kenner, DO;  Location: [place];  Service: Gynecology;  Laterality: N/A;   ESOPHAGOGASTRODUODENOSCOPY  08/2019   MICROTUBOPLASTY  2014   bilateral tubal reversal   OVUM / OOCYTE RETRIEVAL  x2  2013   TUBAL LIGATION Bilateral 2001   WISDOM TOOTH EXTRACTION      Objective:   Today's Vitals: BP 131/82 (BP Location: Left Arm, Patient Position: Sitting, Cuff Size: Large)   Pulse 68   Temp 98.1 F (36.7 C) (Temporal)   Ht '5\' 2"'$  (1.575 m)   Wt 185 lb 2 oz (84 kg)   LMP 08/17/2021 (Exact Date)   SpO2 93%   Breastfeeding No   BMI 33.86 kg/m   Physical Exam Vitals and nursing note reviewed.  Constitutional:      Appearance: Normal appearance. She is obese.  Cardiovascular:     Rate and Rhythm: Normal rate and regular rhythm.  Pulmonary:     Effort: Pulmonary effort is normal.     Breath sounds: Normal breath sounds.  Musculoskeletal:        General: Normal range of motion.     Right wrist: Swelling (dorsal, medial, 1.5 cm cyst) and tenderness present.  Skin:    General: Skin is warm and dry.  Neurological:      Mental Status: She is alert.  Psychiatric:        Mood and Affect: Mood normal.        Behavior: Behavior normal.     Jeanie Sewer, NP

## 2021-08-20 NOTE — Assessment & Plan Note (Signed)
   chronic  stable on Lunesta '2mg'$  qhs  refilling today  f/u in 6 mos

## 2021-08-20 NOTE — Patient Instructions (Signed)
Welcome to [facility], It was a pleasure meeting you today!    As discussed, I have sent your refills to your pharmacy.  I have sent a referral to our Sports Medicine office regarding the cyst on your wrist, they will call you directly.  Please schedule a physical with fasting labs when convenient, and a 6 month follow up for refills today.  Have a great day!    PLEASE NOTE: If you had any LAB tests please let us know if you have not heard back within a few days. You may see your results on MyChart before we have a chance to review them but we will give you a call once they are reviewed by [place]. If we ordered any REFERRALS today, please let us know if you have not heard from their office within the next week.  Let us know through MyChart if you are needing REFILLS, or have your pharmacy send [place] the request. You can also use MyChart to communicate with me or any office staff.   Please try these tips to maintain a healthy lifestyle:  Eat most of your calories during the day when you are active. Eliminate processed foods including packaged sweets (pies, cakes, cookies), reduce intake of potatoes, white bread, white pasta, and white rice. Look for whole grain options, oat flour or almond flour.  Each meal should contain half fruits/vegetables, one quarter protein, and one quarter carbs (no bigger than a computer mouse).  Cut down on sweet beverages. This includes juice, soda, and sweet tea. Also watch fruit intake, though this is a healthier sweet option, it still contains natural sugar! Limit to 3 servings daily.  Drink at least 1 8oz. glass of water with each meal and aim for at least 8 glasses per day  Exercise at least 150 minutes every week.

## 2021-08-20 NOTE — Assessment & Plan Note (Signed)
   chronic  stable with Flonase and claritin qd  refilling Flonase today  f/u in 6 mos

## 2021-08-27 ENCOUNTER — Ambulatory Visit: Payer: Commercial Managed Care - HMO | Admitting: Family Medicine

## 2021-10-06 ENCOUNTER — Encounter: Payer: Self-pay | Admitting: *Deleted

## 2021-10-20 ENCOUNTER — Ambulatory Visit: Payer: Commercial Managed Care - HMO | Admitting: Family

## 2021-10-21 ENCOUNTER — Encounter: Payer: Self-pay | Admitting: Family

## 2021-10-21 ENCOUNTER — Ambulatory Visit (INDEPENDENT_AMBULATORY_CARE_PROVIDER_SITE_OTHER): Payer: Commercial Managed Care - HMO | Admitting: Family

## 2021-10-21 ENCOUNTER — Other Ambulatory Visit: Payer: Self-pay | Admitting: Family

## 2021-10-21 VITALS — BP 118/82 | HR 76 | Temp 98.4°F | Ht 62.0 in | Wt 191.1 lb

## 2021-10-21 DIAGNOSIS — R600 Localized edema: Secondary | ICD-10-CM

## 2021-10-21 DIAGNOSIS — M5136 Other intervertebral disc degeneration, lumbar region: Secondary | ICD-10-CM

## 2021-10-21 MED ORDER — HYDROCHLOROTHIAZIDE 12.5 MG PO TABS: 12.5000 mg | ORAL_TABLET | Freq: Every morning | ORAL | 0 refills | Status: DC

## 2021-10-21 MED ORDER — MELOXICAM 7.5 MG PO TABS: 7.5000 mg | ORAL_TABLET | Freq: Every day | ORAL | 2 refills | Status: DC

## 2021-10-21 NOTE — Assessment & Plan Note (Addendum)
   chronic  has been taking Flexeril tid & Ibuprofen, has had PT in past, did not help  advised to stop Ibuprofen as pt already on 2 antacid meds d/t hiatal hernia/GERD  starting Mobic 7.'5mg'$  qd  f/u prn

## 2021-10-21 NOTE — Assessment & Plan Note (Signed)
   NEW  advised pt on low sodium diet, increasing water intake to 2L qd  starting low dose HCTZ, ok to cut in half if feeling dizzy  f/u in 2 mos.

## 2021-10-21 NOTE — Progress Notes (Signed)
Patient ID: Nicole David, female    DOB: [birth year], 46 y.o.   MRN: [id number]  Chief Complaint  Patient presents with   Back Pain    Pt c/o upper/lower back pain for a couple of weeks. Has tried muscle relaxer and ibuprofen which does not help. Pain feels like it is being pulled and more on the right side.     HPI:      Chronic DDD in back:  Has had for years in cervical and lumbar regions. Pt takes Flexeril qd with relief. Denies any paresthesias.  Pt c/o worsened upper/lower back pain for a couple of weeks. Has tried muscle relaxer and ibuprofen which does not help. Pain feels like it is being pulled and more on the right side.  Edema:  pt reports wt gain of 6lbs in 2 mos and has noticed swelling in feet & ankles and concerned she may have overall swelling d/t wt gain,  no change in her diet. Denies swelling in hands.   Assessment & Plan:   Problem List Items Addressed This Visit       Musculoskeletal and Integument   DDD (degenerative disc disease), lumbar - Primary    chronic has been taking Flexeril tid & Ibuprofen advised to stop Ibuprofen as pt already on 2 antacid meds d/t hiatal hernia/GERD starting Mobic 7.'5mg'$  qd f/u prn      Relevant Medications   meloxicam (MOBIC) 7.5 MG tablet     Other   Fluid retention in legs    NEW advised pt on low sodium diet, increasing water intake to 2L qd starting low dose HCTZ, ok to cut in half if feeling dizzy f/u in 2 mos.      Relevant Medications   hydrochlorothiazide (HYDRODIURIL) 12.5 MG tablet    Subjective:    Outpatient Medications Prior to Visit  Medication Sig Dispense Refill   Cholecalciferol (VITAMIN D3) 1.25 MG (50000 UT) CAPS Take 1 capsule by mouth once a week.     cyclobenzaprine (FLEXERIL) 10 MG tablet Take 1 tablet (10 mg total) by mouth 3 (three) times daily as needed for muscle spasms. 60 tablet 2   escitalopram (LEXAPRO) 20 MG tablet Take 20 mg by mouth daily.     eszopiclone (LUNESTA) 2 MG TABS  tablet TAKE 1 TABLET BY MOUTH IMMEDIATELY BEFORE BEDTIME AS NEEDED 30 tablet 2   famotidine (PEPCID) 40 MG tablet Take 1 tablet (40 mg total) by mouth daily. 90 tablet 1   fluticasone (FLONASE) 50 MCG/ACT nasal spray SHAKE LIQUID AND USE 2 SPRAYS IN EACH NOSTRIL EVERY DAY 18.2 mL 5   loratadine (CLARITIN) 10 MG tablet Take by mouth.     LORazepam (ATIVAN) 0.5 MG tablet Take by mouth.     montelukast (SINGULAIR) 10 MG tablet Take 10 mg by mouth daily.     omeprazole (PRILOSEC) 40 MG capsule Take 1 capsule (40 mg total) by mouth daily. 90 capsule 1   Prenatal Vit-Iron Carbonyl-FA (THRIVITE RX) 29-1 MG TABS Take 1 tablet by mouth daily.     propranolol ER (INDERAL LA) 60 MG 24 hr capsule Take by mouth.     No facility-administered medications prior to visit.   Past Medical History:  Diagnosis Date   Anemia    Anxiety    Depression    GERD (gastroesophageal reflux disease)    Hiatal hernia    History of palpitations (05-31-2020  pt denies any issues)   pt has work-up including ekg and event  monitor done 07/ 2021, ekg okay and montior showed SR -- ST with rare PAC/ PVC (results in care everhwhere   Infertility, female    Insomnia    Migraines    Wears glasses    Past Surgical History:  Procedure Laterality Date   DILATION AND EVACUATION N/A 06/04/2020   Procedure: DILATATION AND EVACUATION;  Surgeon: Allyn Kenner, DO;  Location: [place];  Service: Gynecology;  Laterality: N/A;  Wants Chromosome studies done   DILATION AND EVACUATION N/A 08/05/2020   Procedure: DILATATION AND EVACUATION;  Surgeon: Allyn Kenner, DO;  Location: [place];  Service: Gynecology;  Laterality: N/A;   ESOPHAGOGASTRODUODENOSCOPY  08/2019   MICROTUBOPLASTY  2014   bilateral tubal reversal   OVUM / OOCYTE RETRIEVAL  x2  2013   TUBAL LIGATION Bilateral 2001   WISDOM TOOTH EXTRACTION     Allergies  Allergen Reactions   Sulfa Antibiotics Nausea And Vomiting and Rash    Zithromax [Azithromycin] Rash   Zyrtec [Cetirizine] Rash      Objective:    Physical Exam Vitals and nursing note reviewed.  Constitutional:      Appearance: Normal appearance.  Cardiovascular:     Rate and Rhythm: Normal rate and regular rhythm.  Pulmonary:     Effort: Pulmonary effort is normal.     Breath sounds: Normal breath sounds.  Musculoskeletal:        General: Normal range of motion.     Right lower leg: 2+ Edema present.     Left lower leg: 2+ Edema present.  Skin:    General: Skin is warm and dry.  Neurological:     Mental Status: She is alert.  Psychiatric:        Mood and Affect: Mood normal.        Behavior: Behavior normal.    BP 118/82 (BP Location: Left Arm, Patient Position: Sitting, Cuff Size: Large)   Pulse 76   Temp 98.4 F (36.9 C) (Temporal)   Ht '5\' 2"'$  (1.575 m)   Wt 191 lb 2 oz (86.7 kg)   LMP 10/13/2021 (Exact Date)   SpO2 99%   BMI 34.96 kg/m  Wt Readings from Last 3 Encounters:  10/21/21 191 lb 2 oz (86.7 kg)  08/20/21 185 lb 2 oz (84 kg)  08/05/20 174 lb (78.9 kg)       Jeanie Sewer, NP

## 2021-10-21 NOTE — Patient Instructions (Addendum)
It was very nice to see you today!  Stop taking Ibuprofen or Aleve, any aspirins.  I have sent a stronger anti-inflammatory, Meloxicam to your pharmacy. Take this in the am. Too many anti-inflammatories will aggravate your hiatal hernia symptoms.  I also sent over Hydrochlorothiazide (HCTZ) for your swelling.  Schedule a follow up visit for 1-2 months and we can check your fasting labs at that time.       PLEASE NOTE:  If you had any lab tests please let us know if you have not heard back within a few days. You may see your results on MyChart before we have a chance to review them but we will give you a call once they are reviewed by [place]. If we ordered any referrals today, please let us know if you have not heard from their office within the next week.

## 2021-10-29 ENCOUNTER — Ambulatory Visit (INDEPENDENT_AMBULATORY_CARE_PROVIDER_SITE_OTHER): Payer: Commercial Managed Care - HMO | Admitting: Family

## 2021-10-29 ENCOUNTER — Encounter: Payer: Self-pay | Admitting: Family

## 2021-10-29 VITALS — BP 125/83 | HR 67 | Temp 98.2°F | Ht 62.0 in | Wt 189.0 lb

## 2021-10-29 DIAGNOSIS — K449 Diaphragmatic hernia without obstruction or gangrene: Secondary | ICD-10-CM

## 2021-10-29 DIAGNOSIS — K219 Gastro-esophageal reflux disease without esophagitis: Secondary | ICD-10-CM | POA: Diagnosis not present

## 2021-10-29 NOTE — Progress Notes (Signed)
Patient ID: Nicole David, female    DOB: [birth year], 46 y.o.   MRN: [id number]  Chief Complaint  Patient presents with   Gastroesophageal Reflux    Medication is not helping.     HPI: GERD r/t hiatal hernia:  reports having for years, sx controlled with taking Pepcid and Omeprazole qd. Previously has denied dysphagia or nausea. But recently she is experiencing some dysphagia, sore throat, today she started coughing. Denies any fever or other cold sx. Denies nausea, heartburn or epigastric pain, but reports some regurgitation.   Assessment & Plan:   Problem List Items Addressed This Visit       Respiratory   Hiatal hernia with gastroesophageal reflux - Primary    chronic worsening sx pt states she has a message into her GI for f/u she wanted to come in to check if other cause of her sx mild erythema in pharynx, no lymphedema, no fever advised she increase her Pepcid to bid, keep Omeprazole to qd and see if sx improve even a little and then let GI know at her follow up        Subjective:    Outpatient Medications Prior to Visit  Medication Sig Dispense Refill   Cholecalciferol (VITAMIN D3) 1.25 MG (50000 UT) CAPS Take 1 capsule by mouth once a week.     cyclobenzaprine (FLEXERIL) 10 MG tablet Take 1 tablet (10 mg total) by mouth 3 (three) times daily as needed for muscle spasms. 60 tablet 2   escitalopram (LEXAPRO) 20 MG tablet Take 20 mg by mouth daily.     eszopiclone (LUNESTA) 2 MG TABS tablet TAKE 1 TABLET BY MOUTH IMMEDIATELY BEFORE BEDTIME AS NEEDED 30 tablet 2   famotidine (PEPCID) 40 MG tablet Take 1 tablet (40 mg total) by mouth daily. 90 tablet 1   fluticasone (FLONASE) 50 MCG/ACT nasal spray SHAKE LIQUID AND USE 2 SPRAYS IN EACH NOSTRIL EVERY DAY 18.2 mL 5   hydrochlorothiazide (HYDRODIURIL) 12.5 MG tablet Take 1 tablet (12.5 mg total) by mouth in the morning. 30 tablet 0   loratadine (CLARITIN) 10 MG tablet Take by mouth.     LORazepam (ATIVAN) 0.5 MG  tablet Take by mouth.     meloxicam (MOBIC) 7.5 MG tablet Take 1 tablet (7.5 mg total) by mouth daily. OK to increase to 2 pills daily or 1 pill bid if needed. 45 tablet 2   montelukast (SINGULAIR) 10 MG tablet Take 10 mg by mouth daily.     omeprazole (PRILOSEC) 40 MG capsule Take 1 capsule (40 mg total) by mouth daily. 90 capsule 1   Prenatal Vit-Iron Carbonyl-FA (THRIVITE RX) 29-1 MG TABS Take 1 tablet by mouth daily.     propranolol ER (INDERAL LA) 60 MG 24 hr capsule Take by mouth.     No facility-administered medications prior to visit.   Past Medical History:  Diagnosis Date   Anemia    Anxiety    Depression    GERD (gastroesophageal reflux disease)    Hiatal hernia    History of palpitations (05-31-2020  pt denies any issues)   pt has work-up including ekg and event monitor done 07/ 2021, ekg okay and montior showed SR -- ST with rare PAC/ PVC (results in care everhwhere   Infertility, female    Insomnia    Migraines    Wears glasses    Past Surgical History:  Procedure Laterality Date   DILATION AND EVACUATION N/A 06/04/2020   Procedure: DILATATION AND EVACUATION;  Surgeon: Allyn Kenner, DO;  Location: [facility];  Service: Gynecology;  Laterality: N/A;  Wants Chromosome studies done   DILATION AND EVACUATION N/A 08/05/2020   Procedure: DILATATION AND EVACUATION;  Surgeon: Allyn Kenner, DO;  Location: [place];  Service: Gynecology;  Laterality: N/A;   ESOPHAGOGASTRODUODENOSCOPY  08/2019   MICROTUBOPLASTY  2014   bilateral tubal reversal   OVUM / OOCYTE RETRIEVAL  x2  2013   TUBAL LIGATION Bilateral 2001   WISDOM TOOTH EXTRACTION     Allergies  Allergen Reactions   Sulfa Antibiotics Nausea And Vomiting and Rash   Zithromax [Azithromycin] Rash   Zyrtec [Cetirizine] Rash      Objective:    Physical Exam Vitals and nursing note reviewed.  Constitutional:      Appearance: Normal appearance.  HENT:     Mouth/Throat:      Mouth: Mucous membranes are moist.     Pharynx: No pharyngeal swelling, oropharyngeal exudate, posterior oropharyngeal erythema or uvula swelling.     Tonsils: No tonsillar exudate or tonsillar abscesses.  Cardiovascular:     Rate and Rhythm: Normal rate and regular rhythm.  Pulmonary:     Effort: Pulmonary effort is normal.     Breath sounds: Normal breath sounds.  Musculoskeletal:        General: Normal range of motion.  Lymphadenopathy:     Cervical: No cervical adenopathy.  Skin:    General: Skin is warm and dry.  Neurological:     Mental Status: She is alert.  Psychiatric:        Mood and Affect: Mood normal.        Behavior: Behavior normal.    BP 125/83 (BP Location: Left Arm, Patient Position: Sitting, Cuff Size: Large)   Pulse 67   Temp 98.2 F (36.8 C) (Temporal)   Ht '5\' 2"'$  (1.575 m)   Wt 189 lb (85.7 kg)   LMP 10/13/2021 (Exact Date)   SpO2 99%   BMI 34.57 kg/m  Wt Readings from Last 3 Encounters:  10/29/21 189 lb (85.7 kg)  10/21/21 191 lb 2 oz (86.7 kg)  08/20/21 185 lb 2 oz (84 kg)       Jeanie Sewer, NP

## 2021-10-29 NOTE — Assessment & Plan Note (Signed)
   chronic  worsening sx  pt states she has a message into her GI for f/u  she wanted to come in to check if other cause of her sx  mild erythema in pharynx, no lymphedema, no fever  advised she increase her Pepcid to bid, keep Omeprazole to qd and see if sx improve even a little and then let GI know at her follow up

## 2021-11-04 ENCOUNTER — Ambulatory Visit: Payer: Commercial Managed Care - HMO | Admitting: Family Medicine

## 2021-11-04 ENCOUNTER — Encounter: Payer: Self-pay | Admitting: Family Medicine

## 2021-11-04 VITALS — BP 130/80 | HR 75 | Temp 98.2°F | Ht 62.0 in | Wt 185.2 lb

## 2021-11-04 DIAGNOSIS — J020 Streptococcal pharyngitis: Secondary | ICD-10-CM

## 2021-11-04 LAB — POC COVID19 BINAXNOW: SARS Coronavirus 2 Ag: NEGATIVE

## 2021-11-04 LAB — POCT INFLUENZA A/B
Influenza A, POC: NEGATIVE
Influenza B, POC: NEGATIVE

## 2021-11-04 LAB — POCT RAPID STREP A (OFFICE): Rapid Strep A Screen: POSITIVE — AB

## 2021-11-04 MED ORDER — AMOXICILLIN 875 MG PO TABS: 875.0000 mg | ORAL_TABLET | Freq: Two times a day (BID) | ORAL | 0 refills | Status: DC

## 2021-11-04 MED ORDER — PROMETHAZINE-DM 6.25-15 MG/5ML PO SYRP: 5.0000 mL | ORAL_SOLUTION | Freq: Four times a day (QID) | ORAL | 0 refills | Status: DC | PRN

## 2021-11-04 NOTE — Addendum Note (Signed)
Addended by: Betti Cruz on: 11/04/2021 12:22 PM   Modules accepted: Orders

## 2021-11-04 NOTE — Progress Notes (Signed)
   Nicole David is a 46 y.o. female who presents today for an office visit.  Assessment/Plan:  Sore Throat Rapid strep positive.  Start amoxicillin.  Also start promethazine-dextromethorphan cough syrup.  She can continue using ibuprofen and Tylenol.  Encouraged hydration.  We discussed reasons to return to care.  Follow-up as needed.     Subjective:  HPI:  Patient here with cough and sore throat. Symptoms started about a week ago with sore throat. Started developing more cough, runny nose. Cough medications have helped modestly. No known sick contacts. No fevers or chills.  Has had some left rib pain due to cough.       Objective:  Physical Exam: BP 130/80   Pulse 75   Temp 98.2 F (36.8 C) (Temporal)   Ht '5\' 2"'$  (1.575 m)   Wt 185 lb 3.2 oz (84 kg)   LMP 11/03/2021 (Exact Date)   SpO2 98%   BMI 33.87 kg/m   Gen: No acute distress, resting comfortably CV: Regular rate and rhythm with no murmurs appreciated Pulm: Normal work of breathing, clear to auscultation bilaterally with no crackles, wheezes, or rhonchi Neuro: Grossly normal, moves all extremities Psych: Normal affect and thought content      Nicole David M. Jerline Pain, MD 11/04/2021 12:08 PM

## 2021-11-04 NOTE — Patient Instructions (Signed)
It was very nice to see you today!  Your strep test is positive.  Please start the amoxicillin.  Take the cough syrup.  He can continue taking the over-the-counter medications as well.  Let us know if not improving by next week.  Take care, Dr Jerline Pain  PLEASE NOTE:  If you had any lab tests please let us know if you have not heard back within a few days. You may see your results on mychart before we have a chance to review them but we will give you a call once they are reviewed by [place]. If we ordered any referrals today, please let us know if you have not heard from their office within the next week.   Please try these tips to maintain a healthy lifestyle:  Eat at least 3 REAL meals and 1-2 snacks per day.  Aim for no more than 5 hours between eating.  If you eat breakfast, please do so within one hour of getting up.   Each meal should contain half fruits/vegetables, one quarter protein, and one quarter carbs (no bigger than a computer mouse)  Cut down on sweet beverages. This includes juice, soda, and sweet tea.   Drink at least 1 glass of water with each meal and aim for at least 8 glasses per day  Exercise at least 150 minutes every week.

## 2021-11-14 ENCOUNTER — Other Ambulatory Visit: Payer: Self-pay | Admitting: Family

## 2021-11-14 DIAGNOSIS — R600 Localized edema: Secondary | ICD-10-CM

## 2021-11-17 ENCOUNTER — Other Ambulatory Visit: Payer: Self-pay | Admitting: Family

## 2021-11-17 DIAGNOSIS — R600 Localized edema: Secondary | ICD-10-CM

## 2021-11-19 ENCOUNTER — Ambulatory Visit: Payer: Commercial Managed Care - HMO | Admitting: Family

## 2021-11-20 ENCOUNTER — Ambulatory Visit: Payer: Commercial Managed Care - HMO | Admitting: Family

## 2021-11-20 ENCOUNTER — Encounter: Payer: Self-pay | Admitting: Family

## 2021-11-20 VITALS — BP 120/70 | HR 110 | Temp 98.4°F | Ht 62.0 in | Wt 190.1 lb

## 2021-11-20 DIAGNOSIS — R635 Abnormal weight gain: Secondary | ICD-10-CM | POA: Diagnosis not present

## 2021-11-20 DIAGNOSIS — E782 Mixed hyperlipidemia: Secondary | ICD-10-CM | POA: Insufficient documentation

## 2021-11-20 DIAGNOSIS — M5136 Other intervertebral disc degeneration, lumbar region: Secondary | ICD-10-CM

## 2021-11-20 LAB — BASIC METABOLIC PANEL
BUN: 6 mg/dL (ref 6–23)
CO2: 29 mEq/L (ref 19–32)
Calcium: 8.4 mg/dL (ref 8.4–10.5)
Chloride: 105 mEq/L (ref 96–112)
Creatinine, Ser: 0.9 mg/dL (ref 0.40–1.20)
GFR: 77.13 mL/min (ref >60.00)
Glucose, Bld: 83 mg/dL (ref 70–99)
Potassium: 4.2 mEq/L (ref 3.5–5.1)
Sodium: 141 mEq/L (ref 135–145)

## 2021-11-20 LAB — LIPID PANEL
Cholesterol: 193 mg/dL (ref 0–200)
HDL: 68.1 mg/dL (ref >39.00)
NonHDL: 125.34
Total CHOL/HDL Ratio: 3
Triglycerides: 202 mg/dL — ABNORMAL HIGH (ref 0.0–149.0)
VLDL: 40.4 mg/dL — ABNORMAL HIGH (ref 0.0–40.0)

## 2021-11-20 LAB — LDL CHOLESTEROL, DIRECT: Direct LDL: 108 mg/dL

## 2021-11-20 NOTE — Assessment & Plan Note (Signed)
chronic no meds advised on lifestyle changes, pt admits to not doing this as much as she should reminded pt on eating low saturated foods, no processed or packaged carbs/sweets, start exercise up to 30 minutes as many days as possible will recheck lipids today f/u 18mo-10yr

## 2021-11-20 NOTE — Progress Notes (Signed)
Patient ID: Nicole David, female    DOB: Jan 05, 1976, 46 y.o.   MRN: [id number]  Chief Complaint  Patient presents with   Back Pain    Pt c/o lower back pain for about a week, progressively worse over the weekend. Tried mucles relaxer which does not work and ibuprofen.    Weight Loss    Discuss medication. Has not spoke with insurance to see what is covered.     HPI:      Chronic DDD in back:  Has had for years in cervical and lumbar regions. Pt takes Flexeril qd with relief. Denies any paresthesias.  Pt c/o worsened upper/lower back pain for a couple of weeks. Has tried muscle relaxer and ibuprofen which does not help. Pain feels like it is being pulled and more on the right side. 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  Obesity:  pt has gained over 30lbs since last summer, she is unsure why, reports having 2 GYN surgeries around that time, does not feel she has a bad diet, does not exercise, but walks during day for her work. Reports cutting down her sweets and eating more veges. Denies any hx of commercial wt loss programs or meds in past.   Assessment & Plan:   Problem List Items Addressed This Visit       Musculoskeletal and Integument   DDD (degenerative disc disease), lumbar    chronic not much relief after starting Mobic, added to Flexeril reports walking over weekend and she felt a lot of pain and had to rest frequently wants to lose weight as she knows this would help still seeing chiropractor and Spine specialist in town no refills needed today f/u prn        Other   Mixed hyperlipidemia    chronic no meds advised on lifestyle changes, pt admits to not doing this as much as she should reminded pt on eating low saturated foods, no processed or packaged carbs/sweets, start exercise up to 30 minutes as many days as possible will recheck lipids today f/u 39mo-2yr      Relevant Orders   Lipid panel   Abnormal weight gain - Primary     chronic advised pt to check with insurance to see if any meds are covered can check Qsymia & Plenity websites online Wt. Loss strategies reviewed including portion control, less carbs including sweets, eating most of calories earlier in day, drinking 64oz water qd, and establishing daily exercise routine.  referring to wt [facility] clinic      Relevant Orders   Amb Ref to Medical Weight Management   Basic Metabolic Panel (BMET)    Subjective:    Outpatient Medications Prior to Visit  Medication Sig Dispense Refill   Cholecalciferol (VITAMIN D3) 1.25 MG (50000 UT) CAPS Take 1 capsule by mouth once a week.     cyclobenzaprine (FLEXERIL) 10 MG tablet Take 1 tablet (10 mg total) by mouth 3 (three) times daily as needed for muscle spasms. 60 tablet 2   escitalopram (LEXAPRO) 20 MG tablet Take 20 mg by mouth daily.     eszopiclone (LUNESTA) 2 MG TABS tablet TAKE 1 TABLET BY MOUTH IMMEDIATELY BEFORE BEDTIME AS NEEDED 30 tablet 2   famotidine (PEPCID) 40 MG tablet Take 1 tablet (40 mg total) by mouth daily. 90 tablet 1   fluticasone (FLONASE) 50 MCG/ACT nasal spray SHAKE LIQUID AND USE 2 SPRAYS IN EACH NOSTRIL EVERY DAY 18.2 mL 5  hydrochlorothiazide (HYDRODIURIL) 12.5 MG tablet TAKE 1 TABLET(12.5 MG) BY MOUTH IN THE MORNING 90 tablet 0   loratadine (CLARITIN) 10 MG tablet Take by mouth.     LORazepam (ATIVAN) 0.5 MG tablet Take by mouth.     meloxicam (MOBIC) 7.5 MG tablet Take 1 tablet (7.5 mg total) by mouth daily. OK to increase to 2 pills daily or 1 pill bid if needed. 45 tablet 2   montelukast (SINGULAIR) 10 MG tablet Take 10 mg by mouth daily.     omeprazole (PRILOSEC) 40 MG capsule Take 1 capsule (40 mg total) by mouth daily. 90 capsule 1   Prenatal Vit-Iron Carbonyl-FA (THRIVITE RX) 29-1 MG TABS Take 1 tablet by mouth daily.     promethazine-dextromethorphan (PROMETHAZINE-DM) 6.25-15 MG/5ML syrup Take 5 mLs by mouth 4 (four) times daily as needed for cough. 118 mL 0   propranolol ER  (INDERAL LA) 60 MG 24 hr capsule Take by mouth.     amoxicillin (AMOXIL) 875 MG tablet Take 1 tablet (875 mg total) by mouth 2 (two) times daily. (Patient not taking: Reported on 11/20/2021) 14 tablet 0   No facility-administered medications prior to visit.   Past Medical History:  Diagnosis Date   Anemia    Anxiety    Depression    GERD (gastroesophageal reflux disease)    Hiatal hernia    History of palpitations (05-31-2020  pt denies any issues)   pt has work-up including ekg and event monitor done 07/ 2021, ekg okay and montior showed SR -- ST with rare PAC/ PVC (results in care everhwhere   Infertility, female    Insomnia    Migraines    Wears glasses    Past Surgical History:  Procedure Laterality Date   DILATION AND EVACUATION N/A 06/04/2020   Procedure: DILATATION AND EVACUATION;  Surgeon: Allyn Kenner, DO;  Location: [facility];  Service: Gynecology;  Laterality: N/A;  Wants Chromosome studies done   DILATION AND EVACUATION N/A 08/05/2020   Procedure: DILATATION AND EVACUATION;  Surgeon: Allyn Kenner, DO;  Location: [facility];  Service: Gynecology;  Laterality: N/A;   ESOPHAGOGASTRODUODENOSCOPY  08/2019   MICROTUBOPLASTY  2014   bilateral tubal reversal   OVUM / OOCYTE RETRIEVAL  x2  2013   TUBAL LIGATION Bilateral 2001   WISDOM TOOTH EXTRACTION     Allergies  Allergen Reactions   Sulfa Antibiotics Nausea And Vomiting and Rash   Zithromax [Azithromycin] Rash   Zyrtec [Cetirizine] Rash      Objective:    Physical Exam Vitals and nursing note reviewed.  Constitutional:      Appearance: Normal appearance. She is obese.  Cardiovascular:     Rate and Rhythm: Normal rate and regular rhythm.  Pulmonary:     Effort: Pulmonary effort is normal.     Breath sounds: Normal breath sounds.  Musculoskeletal:        General: Normal range of motion.  Skin:    General: Skin is warm and dry.  Neurological:     Mental Status: She is  alert.  Psychiatric:        Mood and Affect: Mood normal.        Behavior: Behavior normal.    BP 120/70 (BP Location: Left Arm, Patient Position: Sitting, Cuff Size: Large)   Pulse (!) 110   Temp 98.4 F (36.9 C) (Temporal)   Ht '5\' 2"'$  (1.575 m)   Wt 190 lb 2 oz (86.2 kg)   LMP 11/03/2021 (  Exact Date)   SpO2 95%   BMI 34.77 kg/m  Wt Readings from Last 3 Encounters:  11/20/21 190 lb 2 oz (86.2 kg)  11/04/21 185 lb 3.2 oz (84 kg)  10/29/21 189 lb (85.7 kg)       Jeanie Sewer, NP

## 2021-11-20 NOTE — Assessment & Plan Note (Signed)
chronic advised pt to check with insurance to see if any meds are covered can check Qsymia & Plenity websites online Wt. Loss strategies reviewed including portion control, less carbs including sweets, eating most of calories earlier in day, drinking 64oz water qd, and establishing daily exercise routine.  referring to wt [facility] clinic

## 2021-11-20 NOTE — Patient Instructions (Addendum)
It was very nice to see you today!   I will review your lab results via MyChart in a few days.  I have sent a referral to our weight loss clinic.  In the meantime, check with your insurance and see if any meds are covered:  Wegovy, Saxenda, Ozempic (which are injectables) Contrave is a pill.  Look online for Qsymia and [email or web address] these are pills - you sign up and they mail the med to you. Let me know if this is of interest and I can send the RX.     PLEASE NOTE:  If you had any lab tests please let us know if you have not heard back within a few days. You may see your results on MyChart before we have a chance to review them but we will give you a call once they are reviewed by [place]. If we ordered any referrals today, please let us know if you have not heard from their office within the next week.

## 2021-11-20 NOTE — Assessment & Plan Note (Signed)
chronic not much relief after starting Mobic, added to Flexeril reports walking over weekend and she felt a lot of pain and had to rest frequently wants to lose weight as she knows this would help still seeing chiropractor and Spine specialist in town no refills needed today f/u prn

## 2021-11-21 ENCOUNTER — Encounter: Payer: Self-pay | Admitting: Family

## 2021-11-21 DIAGNOSIS — R635 Abnormal weight gain: Secondary | ICD-10-CM

## 2021-11-23 NOTE — Progress Notes (Signed)
Your glucose (no prediabetes), electrolytes, & kidney function are all normal.  Your triglyceride number & LDL/VLDL (bad #'s) are a little high. Try to reduce any fried foods, alcohol, nonnutritional snacks e.g. chips/cookies,pies, cakes and candies, fatty meat (red meat), high fat dairy foods:  including cheese, milk, ice cream.  Increase fruits/vegetables/fiber.   Continue or restart an exercise routine, shooting for 62mn 5-7days per week.   Any questions, let me know!

## 2021-11-25 ENCOUNTER — Encounter (INDEPENDENT_AMBULATORY_CARE_PROVIDER_SITE_OTHER): Payer: Self-pay

## 2021-11-28 NOTE — Telephone Encounter (Signed)
Pt is asking for an update on this msg. She would like to try this rx that was discussed at most recent visit.

## 2021-12-09 ENCOUNTER — Encounter (HOSPITAL_BASED_OUTPATIENT_CLINIC_OR_DEPARTMENT_OTHER): Payer: Self-pay | Admitting: Emergency Medicine

## 2021-12-09 ENCOUNTER — Other Ambulatory Visit: Payer: Self-pay

## 2021-12-09 ENCOUNTER — Other Ambulatory Visit: Payer: Self-pay | Admitting: Family

## 2021-12-09 ENCOUNTER — Emergency Department (HOSPITAL_BASED_OUTPATIENT_CLINIC_OR_DEPARTMENT_OTHER): Payer: Commercial Managed Care - HMO | Admitting: Radiology

## 2021-12-09 ENCOUNTER — Emergency Department (HOSPITAL_BASED_OUTPATIENT_CLINIC_OR_DEPARTMENT_OTHER)
Admission: EM | Admit: 2021-12-09 | Discharge: 2021-12-09 | Disposition: A | Discharge status: Home / Self Care | Payer: Commercial Managed Care - HMO | Attending: Emergency Medicine | Admitting: Emergency Medicine

## 2021-12-09 DIAGNOSIS — M5432 Sciatica, left side: Secondary | ICD-10-CM

## 2021-12-09 DIAGNOSIS — M5431 Sciatica, right side: Secondary | ICD-10-CM | POA: Insufficient documentation

## 2021-12-09 DIAGNOSIS — R0602 Shortness of breath: Secondary | ICD-10-CM | POA: Diagnosis not present

## 2021-12-09 DIAGNOSIS — R600 Localized edema: Secondary | ICD-10-CM

## 2021-12-09 DIAGNOSIS — R2243 Localized swelling, mass and lump, lower limb, bilateral: Secondary | ICD-10-CM | POA: Diagnosis present

## 2021-12-09 LAB — COMPREHENSIVE METABOLIC PANEL
ALT: 26 U/L (ref 0–44)
AST: 46 U/L — ABNORMAL HIGH (ref 15–41)
Albumin: 3.9 g/dL (ref 3.5–5.0)
Alkaline Phosphatase: 47 U/L (ref 38–126)
Anion gap: 7 (ref 5–15)
BUN: 9 mg/dL (ref 6–20)
CO2: 30 mmol/L (ref 22–32)
Calcium: 9.1 mg/dL (ref 8.9–10.3)
Chloride: 103 mmol/L (ref 98–111)
Creatinine, Ser: 1.01 mg/dL — ABNORMAL HIGH (ref 0.44–1.00)
GFR, Estimated: >60 mL/min (ref >60)
Glucose, Bld: 93 mg/dL (ref 70–99)
Potassium: 4.6 mmol/L (ref 3.5–5.1)
Sodium: 140 mmol/L (ref 135–145)
Total Bilirubin: 0.4 mg/dL (ref 0.3–1.2)
Total Protein: 7 g/dL (ref 6.5–8.1)

## 2021-12-09 LAB — CBC WITH DIFFERENTIAL/PLATELET
Abs Immature Granulocytes: 0.03 10*3/uL (ref 0.00–0.07)
Basophils Absolute: 0.1 10*3/uL (ref 0.0–0.1)
Basophils Relative: 1 %
Eosinophils Absolute: 0.5 10*3/uL (ref 0.0–0.5)
Eosinophils Relative: 5 %
HCT: 41.5 % (ref 36.0–46.0)
Hemoglobin: 14.2 g/dL (ref 12.0–15.0)
Immature Granulocytes: 0 %
Lymphocytes Relative: 23 %
Lymphs Abs: 2.2 10*3/uL (ref 0.7–4.0)
MCH: 33.8 pg (ref 26.0–34.0)
MCHC: 34.2 g/dL (ref 30.0–36.0)
MCV: 98.8 fL (ref 80.0–100.0)
Monocytes Absolute: 0.6 10*3/uL (ref 0.1–1.0)
Monocytes Relative: 7 %
Neutro Abs: 6 10*3/uL (ref 1.7–7.7)
Neutrophils Relative %: 64 %
Platelets: 185 10*3/uL (ref 150–400)
RBC: 4.2 MIL/uL (ref 3.87–5.11)
RDW: 13.1 % (ref 11.5–15.5)
WBC: 9.4 10*3/uL (ref 4.0–10.5)
nRBC: 0 % (ref 0.0–0.2)

## 2021-12-09 LAB — BRAIN NATRIURETIC PEPTIDE: B Natriuretic Peptide: 50.5 pg/mL (ref 0.0–100.0)

## 2021-12-09 LAB — TROPONIN I (HIGH SENSITIVITY): Troponin I (High Sensitivity): 2 ng/L (ref <18)

## 2021-12-09 MED ORDER — FUROSEMIDE 20 MG PO TABS: 20.0000 mg | ORAL_TABLET | Freq: Every day | ORAL | 0 refills | Status: DC

## 2021-12-09 MED ORDER — GABAPENTIN 300 MG PO CAPS: 300.0000 mg | ORAL_CAPSULE | Freq: Three times a day (TID) | ORAL | 0 refills | Status: DC | PRN

## 2021-12-09 NOTE — ED Provider Notes (Signed)
[facility] Provider Note   CSN: [id number] Arrival date & time: 12/09/21  1659     History  Chief Complaint  Patient presents with   Leg Swelling    Nicole David is a 46 y.o. female presenting emerged apartment complaining of bilateral leg swelling for about 5 to 6 days.  Reports that they symmetrically began to swell up for the first time a few days ago.  She has been trying to elevate them at home over the weekend but there is still swelling.  She reports she has chronic shortness of breath, she is not aware of any history of congestive heart failure.  She is recently seeing a spinal surgeon or clinic for chronic lower back pain and does suffer from sciatica.  She has put on about 30 pounds in the past 6 months.  Her husband is also present at the bedside.  They report she had very limited mobility due to her pain  HPI     Home Medications Prior to Admission medications   Medication Sig Start Date End Date Taking? Authorizing Provider  furosemide (LASIX) 20 MG tablet Take 1 tablet (20 mg total) by mouth daily for 7 days. 12/09/21 12/16/21 Yes Rosaria Kubin, Carola Rhine, MD  gabapentin (NEURONTIN) 300 MG capsule Take 1 capsule (300 mg total) by mouth 3 (three) times daily as needed. 12/09/21 01/08/22 Yes Calvert Charland, Carola Rhine, MD  amoxicillin (AMOXIL) 875 MG tablet Take 1 tablet (875 mg total) by mouth 2 (two) times daily. Patient not taking: Reported on 11/20/2021 11/04/21   Vivi Barrack, MD  Cholecalciferol (VITAMIN D3) 1.25 MG (50000 UT) CAPS Take 1 capsule by mouth once a week. 08/16/21   [provider]  cyclobenzaprine (FLEXERIL) 10 MG tablet Take 1 tablet (10 mg total) by mouth 3 (three) times daily as needed for muscle spasms. 08/20/21   Jeanie Sewer, NP  escitalopram (LEXAPRO) 20 MG tablet Take 20 mg by mouth daily. 08/16/21   [provider]  eszopiclone (LUNESTA) 2 MG TABS tablet TAKE 1 TABLET BY MOUTH IMMEDIATELY BEFORE BEDTIME  AS NEEDED 08/20/21   Jeanie Sewer, NP  famotidine (PEPCID) 40 MG tablet Take 1 tablet (40 mg total) by mouth daily. 08/20/21   Jeanie Sewer, NP  fluticasone (FLONASE) 50 MCG/ACT nasal spray SHAKE LIQUID AND USE 2 SPRAYS IN EACH NOSTRIL EVERY DAY 08/20/21   Jeanie Sewer, NP  hydrochlorothiazide (HYDRODIURIL) 12.5 MG tablet TAKE 1 TABLET(12.5 MG) BY MOUTH IN THE MORNING 11/17/21   Jeanie Sewer, NP  loratadine (CLARITIN) 10 MG tablet Take by mouth.    [provider]  LORazepam (ATIVAN) 0.5 MG tablet Take by mouth. 08/19/21   [provider]  meloxicam (MOBIC) 7.5 MG tablet Take 1 tablet (7.5 mg total) by mouth daily. OK to increase to 2 pills daily or 1 pill bid if needed. 10/21/21   Jeanie Sewer, NP  montelukast (SINGULAIR) 10 MG tablet Take 10 mg by mouth daily. 08/16/21   [provider]  omeprazole (PRILOSEC) 40 MG capsule Take 1 capsule (40 mg total) by mouth daily. 08/20/21   Jeanie Sewer, NP  Prenatal Vit-Iron Carbonyl-FA (THRIVITE RX) 29-1 MG TABS Take 1 tablet by mouth daily. 08/16/21   [provider]  promethazine-dextromethorphan (PROMETHAZINE-DM) 6.25-15 MG/5ML syrup Take 5 mLs by mouth 4 (four) times daily as needed for cough. 11/04/21   Vivi Barrack, MD  propranolol ER (INDERAL LA) 60 MG 24 hr capsule Take by mouth. 08/02/19   [provider]      Allergies    Sulfa antibiotics, Zithromax [azithromycin], and Zyrtec [cetirizine]    Review of Systems   Review of Systems  Physical Exam Updated Vital Signs BP 128/86   Pulse 84   Temp 98.2 F (36.8 C) (Oral)   Resp 16   Ht '5\' 2"'$  (1.575 m)   Wt 86.6 kg   SpO2 99%   BMI 34.93 kg/m  Physical Exam Constitutional:      General: She is not in acute distress. HENT:     Head: Normocephalic and atraumatic.  Eyes:     Conjunctiva/sclera: Conjunctivae normal.     Pupils: Pupils are equal, round, and reactive to light.  Cardiovascular:     Rate and Rhythm: Normal  rate and regular rhythm.  Pulmonary:     Effort: Pulmonary effort is normal. No respiratory distress.  Abdominal:     General: There is no distension.     Tenderness: There is no abdominal tenderness.  Skin:    General: Skin is warm and dry.     Comments: Symmetrical pitting edema of the bilateral lower extremities up to the thigh  Neurological:     General: No focal deficit present.     Mental Status: She is alert. Mental status is at baseline.  Psychiatric:        Mood and Affect: Mood normal.        Behavior: Behavior normal.     ED Results / Procedures / Treatments   Labs (all labs ordered are listed, but only abnormal results are displayed) Labs Reviewed  COMPREHENSIVE METABOLIC PANEL - Abnormal; Notable for the following components:      Result Value   Creatinine, Ser 1.01 (*)    AST 46 (*)    All other components within normal limits  CBC WITH DIFFERENTIAL/PLATELET  BRAIN NATRIURETIC PEPTIDE  TROPONIN I (HIGH SENSITIVITY)  TROPONIN I (HIGH SENSITIVITY)    EKG None  Radiology DG Chest 2 View  Result Date: 12/09/2021 CLINICAL DATA:  shortness of breath EXAM: CHEST - 2 VIEW COMPARISON:  None Available. FINDINGS: Cardiac silhouette is unremarkable. No pneumothorax or pleural effusion. The lungs are clear. The visualized skeletal structures are unremarkable. IMPRESSION: No acute cardiopulmonary process. Electronically Signed   By: Sammie Bench M.D.   On: 12/09/2021 21:18    Procedures Procedures    Medications Ordered in ED Medications - No data to display  ED Course/ Medical Decision Making/ A&P                           Medical Decision Making Amount and/or Complexity of Data Reviewed Labs: ordered. Radiology: ordered.  Risk Prescription drug management.   Patient is here with lower extremity symmetrical edema.  Differential would include lymphedema versus congestive heart failure versus other  Low suspicion for DVT given that this is symmetrical  and occurred in both legs at the same time.  I do not see an emergent indication for ultrasound at this time.  Blood tests and x-rays were ordered to help evaluate for possible congestive heart failure.  I personally reviewed and interpreted these labs, showing no acute abnormalities, and x-ray was unremarkable.  Supplemental history is provided by the patient's husband at bedside.  We discussed compression stockings and a short course of Lasix at home which may help with some of the excess fluid.  We also discussed a course of gabapentin as needed for her back  pain,        Final Clinical Impression(s) / ED Diagnoses Final diagnoses:  Edema of both lower legs  Bilateral sciatica    Rx / DC Orders ED Discharge Orders          Ordered    furosemide (LASIX) 20 MG tablet  Daily        12/09/21 2242    gabapentin (NEURONTIN) 300 MG capsule  3 times daily PRN        12/09/21 2242              Wyvonnia Dusky, MD 12/09/21 2257

## 2021-12-09 NOTE — ED Triage Notes (Signed)
Pt arrives to ED with c/o leg/feet swelling that started x6 days ago. She notes that she has been less mobile due to severe back pain. Recent 30lb weight gain in 6 months.

## 2021-12-09 NOTE — ED Notes (Signed)
Pt awake and alert - GCS 15.  RR even and unlabored on RA with symmetrical rise and fall of chest; O2 sats 99% on RA.  Pt does not verbalize sob.  Abdomen soft, nontender - pt reports abdomen is more enlarged than usual.  BLE pitting edema (+1 to RLE and +2 LLE); BLE shiny and taut; + pedal pulses.  Pt denies paresthesias - reports BLE have "tight" feeling.  Pt denies high sodium intake; does state she does not drink much water; denies h/o CHF or renal disease.  Awaiting BNP and trop level  - will continue to monitor for acute changes and maintain plan of care.

## 2021-12-09 NOTE — ED Notes (Signed)
Late entry -- Pt asked for her home Flexeril '10mg'$  PO to be ordered (Dr. Langston Masker sent secure chat- awaiting reply).  BLE have been elevated on pillow with heels floated and pt now wearing looser hospital socks - pt was wearing ankle socks that were tight around ankles. Recommended that pt try compression stocking and keep BLE elevated when at home - encouraged low sodium diet and increased water intake

## 2021-12-09 NOTE — ED Notes (Signed)
Pt agreeable with d/c plan as discussed by provider- this nurse has verbally reinforced d/c instructions as discussed by provider- this nurse has verbally reinforced d/c instructions and provided pt with written copy.  Pt acknowledges verbal understanding and denies any addl questions, concerns, needs -  pt ambulatory independently at d/c with steady gait; vitals stable; no acute distress or changes  - accompanied by spouse

## 2021-12-10 ENCOUNTER — Ambulatory Visit: Payer: Commercial Managed Care - HMO | Admitting: Family

## 2021-12-12 ENCOUNTER — Other Ambulatory Visit: Payer: Self-pay | Admitting: Family

## 2021-12-12 DIAGNOSIS — R635 Abnormal weight gain: Secondary | ICD-10-CM

## 2021-12-12 MED ORDER — PHENTERMINE-TOPIRAMATE ER 3.75-23 MG PO CP24: 1.0000 | ORAL_CAPSULE | Freq: Every day | ORAL | 0 refills | Status: DC

## 2021-12-12 MED ORDER — PHENTERMINE-TOPIRAMATE ER 7.5-46 MG PO CP24: 1.0000 | ORAL_CAPSULE | Freq: Every day | ORAL | 0 refills | Status: DC

## 2021-12-14 ENCOUNTER — Encounter: Payer: Self-pay | Admitting: Family

## 2021-12-14 NOTE — Progress Notes (Unsigned)
Patient ID: Nicole David, female    DOB: March 24, 1975, 46 y.o.   MRN: [id number]  No chief complaint on file.   HPI: Chronic DDD in back:  Has had for years in cervical and lumbar regions. Pt takes Flexeril qd with relief. Denies any paresthesias.    Allergic rhinitis:  environmental allergies, denies mold, pet dander, or any food allergies. Controlled with Flonase and Claritin qd. Requesting refills today. GERD r/t hiatal hernia:  reports having for years, sx controlled with taking Pepcid and Omeprazole qd. Previously has denied dysphagia or nausea. But recently she is experiencing some dysphagia, sore throat, today she started coughing. Denies any fever or other cold sx. Denies nausea, heartburn or epigastric pain, but reports some regurgitation.   Assessment & Plan:   Problem List Items Addressed This Visit   None   Subjective:    Outpatient Medications Prior to Visit  Medication Sig Dispense Refill   Phentermine-Topiramate 3.75-23 MG CP24 Take 1 capsule by mouth daily after breakfast. 14 capsule 0   Phentermine-Topiramate 7.5-46 MG CP24 Take 1 capsule by mouth daily after breakfast. 30 capsule 0   amoxicillin (AMOXIL) 875 MG tablet Take 1 tablet (875 mg total) by mouth 2 (two) times daily. (Patient not taking: Reported on 11/20/2021) 14 tablet 0   Cholecalciferol (VITAMIN D3) 1.25 MG (50000 UT) CAPS Take 1 capsule by mouth once a week.     cyclobenzaprine (FLEXERIL) 10 MG tablet Take 1 tablet (10 mg total) by mouth 3 (three) times daily as needed for muscle spasms. 60 tablet 2   escitalopram (LEXAPRO) 20 MG tablet Take 20 mg by mouth daily.     eszopiclone (LUNESTA) 2 MG TABS tablet TAKE 1 TABLET BY MOUTH IMMEDIATELY BEFORE BEDTIME AS NEEDED 30 tablet 2   famotidine (PEPCID) 40 MG tablet Take 1 tablet (40 mg total) by mouth daily. 90 tablet 1   fluticasone (FLONASE) 50 MCG/ACT nasal spray SHAKE LIQUID AND USE 2 SPRAYS IN EACH NOSTRIL EVERY DAY 18.2 mL 5   furosemide (LASIX)  20 MG tablet Take 1 tablet (20 mg total) by mouth daily for 7 days. 7 tablet 0   gabapentin (NEURONTIN) 300 MG capsule Take 1 capsule (300 mg total) by mouth 3 (three) times daily as needed. 30 capsule 0   hydrochlorothiazide (HYDRODIURIL) 12.5 MG tablet TAKE 1 TABLET(12.5 MG) BY MOUTH IN THE MORNING 90 tablet 0   loratadine (CLARITIN) 10 MG tablet Take by mouth.     LORazepam (ATIVAN) 0.5 MG tablet Take by mouth.     meloxicam (MOBIC) 7.5 MG tablet Take 1 tablet (7.5 mg total) by mouth daily. OK to increase to 2 pills daily or 1 pill bid if needed. 45 tablet 2   montelukast (SINGULAIR) 10 MG tablet Take 10 mg by mouth daily.     omeprazole (PRILOSEC) 40 MG capsule Take 1 capsule (40 mg total) by mouth daily. 90 capsule 1   Prenatal Vit-Iron Carbonyl-FA (THRIVITE RX) 29-1 MG TABS Take 1 tablet by mouth daily.     promethazine-dextromethorphan (PROMETHAZINE-DM) 6.25-15 MG/5ML syrup Take 5 mLs by mouth 4 (four) times daily as needed for cough. 118 mL 0   propranolol ER (INDERAL LA) 60 MG 24 hr capsule Take by mouth.     No facility-administered medications prior to visit.   Past Medical History:  Diagnosis Date   Anemia    Anxiety    Depression    GERD (gastroesophageal reflux disease)    Hiatal hernia  History of palpitations (05-31-2020  pt denies any issues)   pt has work-up including ekg and event monitor done 07/ 2021, ekg okay and montior showed SR -- ST with rare PAC/ PVC (results in care everhwhere   Infertility, female    Insomnia    Migraines    Wears glasses    Past Surgical History:  Procedure Laterality Date   DILATION AND EVACUATION N/A 06/04/2020   Procedure: DILATATION AND EVACUATION;  Surgeon: Allyn Kenner, DO;  Location: [facility];  Service: Gynecology;  Laterality: N/A;  Wants Chromosome studies done   DILATION AND EVACUATION N/A 08/05/2020   Procedure: DILATATION AND EVACUATION;  Surgeon: Allyn Kenner, DO;  Location: [facility];  Service: Gynecology;  Laterality: N/A;   ESOPHAGOGASTRODUODENOSCOPY  08/2019   MICROTUBOPLASTY  2014   bilateral tubal reversal   OVUM / OOCYTE RETRIEVAL  x2  2013   TUBAL LIGATION Bilateral 2001   WISDOM TOOTH EXTRACTION     Allergies  Allergen Reactions   Sulfa Antibiotics Nausea And Vomiting and Rash   Zithromax [Azithromycin] Rash   Zyrtec [Cetirizine] Rash      Objective:    Physical Exam Vitals and nursing note reviewed.  Constitutional:      Appearance: Normal appearance.  Cardiovascular:     Rate and Rhythm: Normal rate and regular rhythm.  Pulmonary:     Effort: Pulmonary effort is normal.     Breath sounds: Normal breath sounds.  Musculoskeletal:        General: Normal range of motion.  Skin:    General: Skin is warm and dry.  Neurological:     Mental Status: She is alert.  Psychiatric:        Mood and Affect: Mood normal.        Behavior: Behavior normal.    There were no vitals taken for this visit. Wt Readings from Last 3 Encounters:  12/09/21 191 lb (86.6 kg)  11/20/21 190 lb 2 oz (86.2 kg)  11/04/21 185 lb 3.2 oz (84 kg)       Jeanie Sewer, NP

## 2021-12-15 ENCOUNTER — Encounter: Payer: Self-pay | Admitting: Family

## 2021-12-15 ENCOUNTER — Ambulatory Visit (INDEPENDENT_AMBULATORY_CARE_PROVIDER_SITE_OTHER): Payer: Commercial Managed Care - HMO | Admitting: Family

## 2021-12-15 VITALS — BP 122/84 | HR 71 | Temp 97.3°F | Ht 62.0 in | Wt 198.1 lb

## 2021-12-15 DIAGNOSIS — F411 Generalized anxiety disorder: Secondary | ICD-10-CM

## 2021-12-15 DIAGNOSIS — M5136 Other intervertebral disc degeneration, lumbar region: Secondary | ICD-10-CM

## 2021-12-15 DIAGNOSIS — R600 Localized edema: Secondary | ICD-10-CM

## 2021-12-15 DIAGNOSIS — F5101 Primary insomnia: Secondary | ICD-10-CM

## 2021-12-15 DIAGNOSIS — M5030 Other cervical disc degeneration, unspecified cervical region: Secondary | ICD-10-CM | POA: Diagnosis not present

## 2021-12-15 MED ORDER — ESCITALOPRAM OXALATE 20 MG PO TABS: 20.0000 mg | ORAL_TABLET | Freq: Every day | ORAL | 1 refills | Status: DC

## 2021-12-15 MED ORDER — FUROSEMIDE 20 MG PO TABS: 20.0000 mg | ORAL_TABLET | Freq: Every day | ORAL | 2 refills | Status: DC

## 2021-12-15 MED ORDER — BUPROPION HCL ER (SR) 100 MG PO TB12: 100.0000 mg | ORAL_TABLET | Freq: Two times a day (BID) | ORAL | 0 refills | Status: DC

## 2021-12-15 MED ORDER — GABAPENTIN 300 MG PO CAPS: 300.0000 mg | ORAL_CAPSULE | Freq: Three times a day (TID) | ORAL | 2 refills | Status: DC | PRN

## 2021-12-15 MED ORDER — CYCLOBENZAPRINE HCL 10 MG PO TABS: 10.0000 mg | ORAL_TABLET | Freq: Three times a day (TID) | ORAL | 2 refills | Status: DC | PRN

## 2021-12-15 NOTE — Assessment & Plan Note (Addendum)
chronic - Unstable waiting on MRI approval from insurance had a pain exacerbation, called [facility] & told her to go to the ER - given Gabapentin which helps if taken with Flexeril & Mobic refilling Flexeril & Gabapentin today f/u 6 mos

## 2021-12-15 NOTE — Assessment & Plan Note (Signed)
chronic - Unstable waiting on MRI approval from insurance had a pain exacerbation, called [facility] & told her to go to the ER - given Gabapentin which helps if taken with Flexeril & Mobic refilling Flexeril & Gabapentin today f/u 6 mos

## 2021-12-15 NOTE — Assessment & Plan Note (Addendum)
HCTZ 12.'5mg'$  qam had been helping sx had a sudden increase in swelling last week - seen in ER & given Lasix '20mg'$  which has helped, advised to stop the HCTZ, cardiac origin for edema r/o ran out 2 days ago and legs with 2+ swelling which is much improved from when in ER reports compression socks help some, not wearing today refilling Lasix, advised to take for 3-4 d at a time and then prn f/u 1 month

## 2021-12-15 NOTE — Assessment & Plan Note (Signed)
Chronic - Unstable Lexapro no longer working, on for over a year, having to take more of her Ativan most likely d/t increased back/neck pain, pt discouraged she is gaining weight, having edema, not able to exercise like she was reports several other anti-depressants having SE and unable to take, but does not remember which adding Wellbutrin '100mg'$  SR bid, advised on use & SE f/u 1 mo

## 2021-12-15 NOTE — Telephone Encounter (Signed)
refilled during office visit today.

## 2021-12-17 ENCOUNTER — Ambulatory Visit (INDEPENDENT_AMBULATORY_CARE_PROVIDER_SITE_OTHER): Payer: Commercial Managed Care - HMO | Admitting: Family

## 2021-12-17 ENCOUNTER — Encounter: Payer: Self-pay | Admitting: Family

## 2021-12-17 VITALS — BP 100/74 | HR 95 | Temp 97.9°F | Ht 62.0 in | Wt 192.2 lb

## 2021-12-17 DIAGNOSIS — L989 Disorder of the skin and subcutaneous tissue, unspecified: Secondary | ICD-10-CM | POA: Diagnosis not present

## 2021-12-17 DIAGNOSIS — R6889 Other general symptoms and signs: Secondary | ICD-10-CM

## 2021-12-17 LAB — POCT INFLUENZA A/B
Influenza A, POC: NEGATIVE
Influenza B, POC: NEGATIVE

## 2021-12-17 LAB — POC COVID19 BINAXNOW: SARS Coronavirus 2 Ag: NEGATIVE

## 2021-12-17 NOTE — Progress Notes (Signed)
Patient ID: Nicole David, female    DOB: [birth year], 46 y.o.   MRN: [id number]  Chief Complaint  Patient presents with   Insect Bite    Pt c/o fever, chills and body aches on Monday. Noticed the bite Monday evening, Bite is on top if pt's head. There is some redness and swelling at bite.     HPI:      Skin lesion:  started feeling sick on Monday and had a headache so was rubbing her scalp and noticed a bump and wondered if something bit her. Reports tenderness & mild itching.   Fever, chills:  with body aches & headache, denies sore throat, sinus sx or cough, but all sx hit her suddenly. She has not received a flu vaccine.       Assessment & Plan:  1. Skin lesion of scalp - appears as a pinkish-red mole, has drained a scant amt of serous fluid, no swelling or erythema noted, mild tenderness w/palpation. Advised pt to continue & monitor, if sx worsen, let me know.  2. Flu-like symptoms - rapid testing neg. Advised pt to take up to '600mg'$  of Ibuprofen tid, take OTC sinus meds if needed, drink plenty of fluids.  - POC COVID-19 - POCT Influenza A/B   Subjective:    Outpatient Medications Prior to Visit  Medication Sig Dispense Refill   buPROPion ER (WELLBUTRIN SR) 100 MG 12 hr tablet Take 1 tablet (100 mg total) by mouth 2 (two) times daily. Start with one pill in the morning for 3-4 days, then increase to twice a day. 60 tablet 0   Cholecalciferol (VITAMIN D3) 1.25 MG (50000 UT) CAPS Take 1 capsule by mouth once a week.     cyclobenzaprine (FLEXERIL) 10 MG tablet Take 1 tablet (10 mg total) by mouth 3 (three) times daily as needed for muscle spasms. 60 tablet 2   escitalopram (LEXAPRO) 20 MG tablet Take 1 tablet (20 mg total) by mouth daily. 90 tablet 1   eszopiclone (LUNESTA) 2 MG TABS tablet TAKE 1 TABLET BY MOUTH IMMEDIATELY BEFORE BEDTIME AS NEEDED 30 tablet 2   famotidine (PEPCID) 40 MG tablet Take 1 tablet (40 mg total) by mouth daily. 90 tablet 1   fluticasone (FLONASE)  50 MCG/ACT nasal spray SHAKE LIQUID AND USE 2 SPRAYS IN EACH NOSTRIL EVERY DAY 18.2 mL 5   furosemide (LASIX) 20 MG tablet Take 1 tablet (20 mg total) by mouth daily. 30 tablet 2   gabapentin (NEURONTIN) 300 MG capsule Take 1 capsule (300 mg total) by mouth 3 (three) times daily as needed. 90 capsule 2   loratadine (CLARITIN) 10 MG tablet Take by mouth.     LORazepam (ATIVAN) 0.5 MG tablet Take by mouth.     meloxicam (MOBIC) 7.5 MG tablet Take 1 tablet (7.5 mg total) by mouth daily. OK to increase to 2 pills daily or 1 pill bid if needed. 45 tablet 2   montelukast (SINGULAIR) 10 MG tablet Take 10 mg by mouth daily.     omeprazole (PRILOSEC) 40 MG capsule Take 1 capsule (40 mg total) by mouth daily. 90 capsule 1   Phentermine-Topiramate 3.75-23 MG CP24 Take 1 capsule by mouth daily after breakfast. 14 capsule 0   Phentermine-Topiramate 7.5-46 MG CP24 Take 1 capsule by mouth daily after breakfast. 30 capsule 0   Prenatal Vit-Iron Carbonyl-FA (THRIVITE RX) 29-1 MG TABS Take 1 tablet by mouth daily.     propranolol ER (INDERAL LA) 60 MG 24 hr  capsule Take by mouth.     No facility-administered medications prior to visit.   Past Medical History:  Diagnosis Date   Anemia    Anxiety    Depression    GERD (gastroesophageal reflux disease)    Hiatal hernia    History of palpitations (05-31-2020  pt denies any issues)   pt has work-up including ekg and event monitor done 07/ 2021, ekg okay and montior showed SR -- ST with rare PAC/ PVC (results in care everhwhere   Infertility, female    Insomnia    Migraines    Wears glasses    Past Surgical History:  Procedure Laterality Date   DILATION AND EVACUATION N/A 06/04/2020   Procedure: DILATATION AND EVACUATION;  Surgeon: Allyn Kenner, DO;  Location: [place];  Service: Gynecology;  Laterality: N/A;  Wants Chromosome studies done   DILATION AND EVACUATION N/A 08/05/2020   Procedure: DILATATION AND EVACUATION;  Surgeon:  Allyn Kenner, DO;  Location: [place];  Service: Gynecology;  Laterality: N/A;   ESOPHAGOGASTRODUODENOSCOPY  08/2019   MICROTUBOPLASTY  2014   bilateral tubal reversal   OVUM / OOCYTE RETRIEVAL  x2  2013   TUBAL LIGATION Bilateral 2001   WISDOM TOOTH EXTRACTION     Allergies  Allergen Reactions   Sulfa Antibiotics Nausea And Vomiting and Rash   Zithromax [Azithromycin] Rash   Zyrtec [Cetirizine] Rash      Objective:    Physical Exam Vitals and nursing note reviewed.  Constitutional:      Appearance: Normal appearance. She is not ill-appearing.     Interventions: Face mask in place.  HENT:     Right Ear: Tympanic membrane and ear canal normal.     Left Ear: Tympanic membrane and ear canal normal.     Nose:     Right Sinus: No frontal sinus tenderness.     Left Sinus: No frontal sinus tenderness.     Mouth/Throat:     Mouth: Mucous membranes are moist.     Pharynx: Posterior oropharyngeal erythema present. No pharyngeal swelling, oropharyngeal exudate or uvula swelling.     Tonsils: No tonsillar exudate or tonsillar abscesses.  Cardiovascular:     Rate and Rhythm: Normal rate and regular rhythm.  Pulmonary:     Effort: Pulmonary effort is normal.     Breath sounds: Normal breath sounds.  Musculoskeletal:        General: Normal range of motion.  Lymphadenopathy:     Head:     Right side of head: No preauricular or posterior auricular adenopathy.     Left side of head: No preauricular or posterior auricular adenopathy.     Cervical: No cervical adenopathy.  Skin:    General: Skin is warm and dry.  Neurological:     Mental Status: She is alert.  Psychiatric:        Mood and Affect: Mood normal.        Behavior: Behavior normal.    BP 100/74 (BP Location: Left Arm, Patient Position: Sitting, Cuff Size: Large)   Pulse 95   Temp 97.9 F (36.6 C) (Temporal)   Ht '5\' 2"'$  (1.575 m)   Wt 192 lb 4 oz (87.2 kg)   LMP  (LMP Unknown)   SpO2 100%   BMI  35.16 kg/m  Wt Readings from Last 3 Encounters:  12/17/21 192 lb 4 oz (87.2 kg)  12/15/21 198 lb 2 oz (89.9 kg)  12/09/21 191 lb (86.6 kg)  Jeanie Sewer, NP

## 2021-12-18 ENCOUNTER — Other Ambulatory Visit: Payer: Self-pay | Admitting: Family

## 2021-12-19 ENCOUNTER — Other Ambulatory Visit: Payer: Self-pay

## 2021-12-19 ENCOUNTER — Encounter: Payer: Self-pay | Admitting: Family

## 2021-12-19 DIAGNOSIS — L989 Disorder of the skin and subcutaneous tissue, unspecified: Secondary | ICD-10-CM

## 2021-12-19 MED ORDER — LORAZEPAM 0.5 MG PO TABS
0.5000 mg | ORAL_TABLET | Freq: Every day | ORAL | 0 refills | Status: DC | PRN
  Filled 2021-12-19 – 2022-02-13 (×2): qty 30, 30d supply, fill #0

## 2021-12-19 NOTE — Telephone Encounter (Signed)
this is for the scalp lesion? yes, thx

## 2021-12-20 ENCOUNTER — Encounter: Payer: Self-pay | Admitting: Family

## 2021-12-20 DIAGNOSIS — S0006XS Insect bite (nonvenomous) of scalp, sequela: Secondary | ICD-10-CM

## 2021-12-22 ENCOUNTER — Ambulatory Visit: Payer: Commercial Managed Care - HMO | Admitting: Family

## 2021-12-22 MED ORDER — DOXYCYCLINE HYCLATE 100 MG PO TABS: 100.0000 mg | ORAL_TABLET | Freq: Two times a day (BID) | ORAL | 0 refills | Status: AC

## 2021-12-24 ENCOUNTER — Ambulatory Visit: Payer: Commercial Managed Care - HMO | Admitting: Family

## 2022-01-09 ENCOUNTER — Other Ambulatory Visit: Payer: Self-pay | Admitting: Family

## 2022-01-09 DIAGNOSIS — F411 Generalized anxiety disorder: Secondary | ICD-10-CM

## 2022-02-08 ENCOUNTER — Other Ambulatory Visit: Payer: Self-pay | Admitting: Family

## 2022-02-08 DIAGNOSIS — J3089 Other allergic rhinitis: Secondary | ICD-10-CM

## 2022-02-08 DIAGNOSIS — K219 Gastro-esophageal reflux disease without esophagitis: Secondary | ICD-10-CM

## 2022-02-09 ENCOUNTER — Encounter: Payer: Self-pay | Admitting: Family

## 2022-02-10 ENCOUNTER — Other Ambulatory Visit: Payer: Self-pay | Admitting: Family

## 2022-02-10 DIAGNOSIS — K219 Gastro-esophageal reflux disease without esophagitis: Secondary | ICD-10-CM

## 2022-02-10 DIAGNOSIS — J3089 Other allergic rhinitis: Secondary | ICD-10-CM

## 2022-02-11 NOTE — Telephone Encounter (Signed)
Ok to send 90day supply, thanks

## 2022-02-14 ENCOUNTER — Other Ambulatory Visit (HOSPITAL_COMMUNITY): Payer: Self-pay

## 2022-02-16 ENCOUNTER — Other Ambulatory Visit: Payer: Self-pay

## 2022-02-17 ENCOUNTER — Other Ambulatory Visit: Payer: Self-pay

## 2022-02-19 ENCOUNTER — Other Ambulatory Visit: Payer: Self-pay

## 2022-02-20 ENCOUNTER — Encounter: Payer: Commercial Managed Care - HMO | Admitting: Family

## 2022-03-02 ENCOUNTER — Other Ambulatory Visit: Payer: Self-pay | Admitting: Family

## 2022-03-02 DIAGNOSIS — R635 Abnormal weight gain: Secondary | ICD-10-CM

## 2022-03-03 NOTE — Telephone Encounter (Signed)
Nicole David needs an appointment so we can check her weight & vitals before I reorder. Also does she want the Propranolol & vitamin to go to the [facility] pharmacy where she gets the Qsymia?

## 2022-03-04 ENCOUNTER — Other Ambulatory Visit: Payer: Self-pay | Admitting: Family

## 2022-03-04 DIAGNOSIS — G43709 Chronic migraine without aura, not intractable, without status migrainosus: Secondary | ICD-10-CM

## 2022-03-04 MED ORDER — PROPRANOLOL HCL ER 60 MG PO CP24: 60.0000 mg | ORAL_CAPSULE | Freq: Every day | ORAL | 5 refills | Status: DC

## 2022-03-04 MED ORDER — THRIVITE RX 29-1 MG PO TABS: 1.0000 | ORAL_TABLET | Freq: Every day | ORAL | 5 refills | Status: DC

## 2022-03-05 ENCOUNTER — Encounter: Payer: Self-pay | Admitting: Family

## 2022-03-05 ENCOUNTER — Ambulatory Visit (INDEPENDENT_AMBULATORY_CARE_PROVIDER_SITE_OTHER): Payer: Commercial Managed Care - HMO | Admitting: Family

## 2022-03-05 VITALS — BP 120/77 | HR 72 | Temp 97.7°F | Ht 62.0 in | Wt 191.6 lb

## 2022-03-05 DIAGNOSIS — G43709 Chronic migraine without aura, not intractable, without status migrainosus: Secondary | ICD-10-CM

## 2022-03-05 DIAGNOSIS — Z0001 Encounter for general adult medical examination with abnormal findings: Secondary | ICD-10-CM | POA: Diagnosis not present

## 2022-03-05 DIAGNOSIS — R635 Abnormal weight gain: Secondary | ICD-10-CM | POA: Diagnosis not present

## 2022-03-05 LAB — TSH: TSH: 2.15 u[IU]/mL (ref 0.35–5.50)

## 2022-03-05 LAB — COMPREHENSIVE METABOLIC PANEL
ALT: 18 U/L (ref 0–35)
AST: 21 U/L (ref 0–37)
Albumin: 3.8 g/dL (ref 3.5–5.2)
Alkaline Phosphatase: 53 U/L (ref 39–117)
BUN: 6 mg/dL (ref 6–23)
CO2: 27 mEq/L (ref 19–32)
Calcium: 9 mg/dL (ref 8.4–10.5)
Chloride: 105 mEq/L (ref 96–112)
Creatinine, Ser: 0.94 mg/dL (ref 0.40–1.20)
GFR: 73.06 mL/min (ref >60.00)
Glucose, Bld: 74 mg/dL (ref 70–99)
Potassium: 3.7 mEq/L (ref 3.5–5.1)
Sodium: 139 mEq/L (ref 135–145)
Total Bilirubin: 0.4 mg/dL (ref 0.2–1.2)
Total Protein: 6.3 g/dL (ref 6.0–8.3)

## 2022-03-05 LAB — LIPID PANEL
Cholesterol: 193 mg/dL (ref 0–200)
HDL: 56.5 mg/dL (ref >39.00)
LDL Cholesterol: 109 mg/dL — ABNORMAL HIGH (ref 0–99)
NonHDL: 136.62
Total CHOL/HDL Ratio: 3
Triglycerides: 138 mg/dL (ref 0.0–149.0)
VLDL: 27.6 mg/dL (ref 0.0–40.0)

## 2022-03-05 MED ORDER — PHENTERMINE-TOPIRAMATE ER 11.25-69 MG PO CP24: 1.0000 | ORAL_CAPSULE | Freq: Every day | ORAL | 2 refills | Status: DC

## 2022-03-05 MED ORDER — PROPRANOLOL HCL ER 60 MG PO CP24: 60.0000 mg | ORAL_CAPSULE | Freq: Every day | ORAL | 5 refills | Status: DC

## 2022-03-05 NOTE — Assessment & Plan Note (Signed)
chronic pt started on Qsymia last visit, up to 7.210m dose, wt down 1 lb will increase to 168m advised again on use & SE continue to advise on wt. Loss strategies reviewed including portion control, less carbs including sweets, eating most of calories earlier in day, drinking 64oz water qd, and establishing daily exercise routine.  f/u 10m58mo

## 2022-03-05 NOTE — Progress Notes (Signed)
Phone [phone]  Subjective:   Patient is a 47 y.o. female presenting for annual physical.    Chief Complaint  Patient presents with   Annual Exam    Pt is not fasting, pt states she is getting pap at OB   abnormal weight gain   HPI: Migraines: pt reports having migraines w/o aura for years, had taken Topamax which worked, but not on Qsymia for weight loss and requesting refill of Propranolol which also helps just not as well as the Topamax. Reports having about 2-3 severe headaches per week, one of which is a migraine. Sx restarted a few weeks ago.  Obesity:  pt has gained over 30lbs since last summer, she is unsure why, reports having 2 GYN surgeries around that time, does not feel she has a bad diet, does not exercise, but walks during day for her work. Reports cutting down her sweets and eating more veges. Denies any hx of commercial wt loss programs or meds in past.   See problem oriented charting- ROS- full  review of systems was completed and negative except for: Obesity & migraines noted in HPI above.  The following were reviewed and entered/updated in epic: Past Medical History:  Diagnosis Date   Anemia    Anxiety    Depression    GERD (gastroesophageal reflux disease)    Hiatal hernia    History of palpitations (05-31-2020  pt denies any issues)   pt has work-up including ekg and event monitor done 07/ 2021, ekg okay and montior showed SR -- ST with rare PAC/ PVC (results in care everhwhere   Infertility, female    Insomnia    Migraines    Wears glasses    Patient Active Problem List   Diagnosis Date Noted   Generalized anxiety disorder 12/15/2021   Mixed hyperlipidemia 11/20/2021   Abnormal weight gain 11/20/2021   Fluid retention in legs 10/21/2021   DDD (degenerative disc disease), cervical 08/20/2021   Allergic rhinitis 08/20/2021   Hiatal hernia with gastroesophageal reflux 08/20/2021   DDD (degenerative disc disease), lumbar 08/20/2021    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   Vitamin D deficiency 06/10/2018   Vitamin B12 deficiency 06/10/2018   Chronic migraine without aura without status migrainosus, not intractable 10/29/2010   Insomnia 09/16/2010   Past Surgical History:  Procedure Laterality Date   DILATION AND EVACUATION N/A 06/04/2020   Procedure: DILATATION AND EVACUATION;  Surgeon: Allyn Kenner, DO;  Location: [place];  Service: Gynecology;  Laterality: N/A;  Wants Chromosome studies done   DILATION AND EVACUATION N/A 08/05/2020   Procedure: DILATATION AND EVACUATION;  Surgeon: Allyn Kenner, DO;  Location: [place];  Service: Gynecology;  Laterality: N/A;   ESOPHAGOGASTRODUODENOSCOPY  08/2019   MICROTUBOPLASTY  2014   bilateral tubal reversal   OVUM / OOCYTE RETRIEVAL  x2  2013   TUBAL LIGATION Bilateral 2001   WISDOM TOOTH EXTRACTION      History reviewed. No pertinent family history.  Medications- reviewed and updated Current Outpatient Medications  Medication Sig Dispense Refill   buPROPion ER (WELLBUTRIN SR) 100 MG 12 hr tablet TAKE 1 TABLET(100 MG) BY MOUTH TWICE DAILY. START WITH 1 PILL IN THE MORNING FOR 3-4 DAYS, THEN. INCREASE TO 2 TIMES A DAY 60 tablet 0   Cholecalciferol (VITAMIN D3) 1.25 MG (50000 UT) CAPS Take 1 capsule by mouth once a week.     cyclobenzaprine (FLEXERIL) 10 MG tablet Take 1 tablet (10 mg total) by mouth  3 (three) times daily as needed for muscle spasms. 60 tablet 2   escitalopram (LEXAPRO) 20 MG tablet Take 1 tablet (20 mg total) by mouth daily. 90 tablet 1   eszopiclone (LUNESTA) 2 MG TABS tablet TAKE 1 TABLET BY MOUTH IMMEDIATELY BEFORE BEDTIME AS NEEDED 30 tablet 2   famotidine (PEPCID) 40 MG tablet TAKE 1 TABLET(40 MG) BY MOUTH DAILY 90 tablet 1   fluticasone (FLONASE) 50 MCG/ACT nasal spray SHAKE LIQUID AND USE 2 SPRAYS IN EACH NOSTRIL EVERY DAY 16 g 0   furosemide (LASIX) 20 MG tablet Take 1 tablet (20 mg total) by mouth  daily. 30 tablet 2   gabapentin (NEURONTIN) 300 MG capsule Take 1 capsule (300 mg total) by mouth 3 (three) times daily as needed. 90 capsule 2   loratadine (CLARITIN) 10 MG tablet Take by mouth.     LORazepam (ATIVAN) 0.5 MG tablet Take 1 tablet (0.5 mg total) by mouth daily as needed for anxiety. 30 tablet 0   meloxicam (MOBIC) 7.5 MG tablet Take 1 tablet (7.5 mg total) by mouth daily. OK to increase to 2 pills daily or 1 pill bid if needed. 45 tablet 2   montelukast (SINGULAIR) 10 MG tablet Take 10 mg by mouth daily.     omeprazole (PRILOSEC) 40 MG capsule TAKE 1 CAPSULE(40 MG) BY MOUTH DAILY 90 capsule 1   Phentermine-Topiramate 11.25-69 MG CP24 Take 1 tablet by mouth daily at 12 noon. 30 capsule 2   Prenatal Vit-Iron Carbonyl-FA (THRIVITE RX) 29-1 MG TABS Take 1 tablet by mouth daily. 30 tablet 5   propranolol ER (INDERAL LA) 60 MG 24 hr capsule Take 1 capsule (60 mg total) by mouth daily. 30 capsule 5   No current facility-administered medications for this visit.    Allergies-reviewed and updated Allergies  Allergen Reactions   Sulfa Antibiotics Nausea And Vomiting and Rash   Zithromax [Azithromycin] Rash   Zyrtec [Cetirizine] Rash    Social History   Social History Narrative   Not on file    Objective:  BP 120/77 (BP Location: Left Arm, Patient Position: Sitting)   Pulse 72   Temp 97.7 F (36.5 C) (Temporal)   Ht 5' 2"$  (1.575 m)   Wt 191 lb 9.6 oz (86.9 kg)   LMP 02/18/2022 (Exact Date)   SpO2 98%   BMI 35.04 kg/m  Physical Exam Vitals and nursing note reviewed.  Constitutional:      Appearance: Normal appearance. She is not ill-appearing.     Interventions: Face mask in place.  HENT:     Head: Normocephalic.     Right Ear: Tympanic membrane and ear canal normal.     Left Ear: Tympanic membrane and ear canal normal.     Nose:     Right Sinus: No frontal sinus tenderness.     Left Sinus: No frontal sinus tenderness.     Mouth/Throat:     Mouth: Mucous  membranes are moist.     Pharynx: No pharyngeal swelling, oropharyngeal exudate, posterior oropharyngeal erythema or uvula swelling.     Tonsils: No tonsillar exudate or tonsillar abscesses.  Eyes:     Pupils: Pupils are equal, round, and reactive to light.  Cardiovascular:     Rate and Rhythm: Normal rate and regular rhythm.  Pulmonary:     Effort: Pulmonary effort is normal.     Breath sounds: Normal breath sounds.  Musculoskeletal:        General: Normal range of motion.  Cervical back: Normal range of motion.  Lymphadenopathy:     Head:     Right side of head: No preauricular or posterior auricular adenopathy.     Left side of head: No preauricular or posterior auricular adenopathy.     Cervical: No cervical adenopathy.  Skin:    General: Skin is warm and dry.  Neurological:     Mental Status: She is alert.  Psychiatric:        Mood and Affect: Mood normal.        Behavior: Behavior normal.      Assessment and Plan   Health Maintenance counseling: 1. Anticipatory guidance: Patient counseled regarding regular dental exams q6 months, eye exams,  avoiding smoking and second hand smoke, limiting alcohol to 1 beverage per day, no illicit drugs.   2. Risk factor reduction:  Advised patient of need for regular exercise and diet rich with fruits and vegetables to reduce risk of heart attack and stroke. Exercise- walking.  Wt Readings from Last 3 Encounters:  03/05/22 191 lb 9.6 oz (86.9 kg)  12/17/21 192 lb 4 oz (87.2 kg)  12/15/21 198 lb 2 oz (89.9 kg)   3. Immunizations/screenings/ancillary studies Immunization History  Administered Date(s) Administered   Moderna Sars-Covid-2 Vaccination 05/08/2019, 06/05/2019   Td (Adult), 2 Lf Tetanus Toxid, Preservative Free 01/12/2005   Tdap 05/13/2015   Health Maintenance Due  Topic Date Due   PAP SMEAR-Modifier  Never done   COLONOSCOPY (Pts 45-64yr Insurance coverage will need to be confirmed)  Never done    4. Cervical  cancer screening- getting done thru GYN 5. Breast cancer screening-  mammogram: due this year 6. Colon cancer screening - getting scheduled 7. Skin cancer screening- advised regular sunscreen use. Denies worrisome, changing, or new skin lesions.  8. Birth control/STD check- N/A 9. Osteoporosis screening- N/A 10. Alcohol screening: rare  11. Smoking associated screening (lung cancer screening, AAA screen 65-75, UA)- non- smoker  Problem List Items Addressed This Visit       Cardiovascular and Mediastinum   Chronic migraine without aura without status migrainosus, not intractable - Primary    chronic reports having 2-3 per week, sometimes bad headaches sending refill of Propranolol 666mER pt reports taking Topamax in past which worked for her but she is taking low dose in Qsymia f/u 6 mos       Relevant Medications   propranolol ER (INDERAL LA) 60 MG 24 hr capsule     Other   Abnormal weight gain    chronic pt started on Qsymia last visit, up to 7.96m13mose, wt down 1 lb will increase to 50m396mdvised again on use & SE continue to advise on wt. Loss strategies reviewed including portion control, less carbs including sweets, eating most of calories earlier in day, drinking 64oz water qd, and establishing daily exercise routine.  f/u 2mos21mo   Relevant Medications   Phentermine-Topiramate 11.25-69 MG CP24   Other Visit Diagnoses     Encounter for general adult medical examination with abnormal findings       Relevant Orders   Lipid panel (Completed)   Comp Met (CMET) (Completed)   TSH (Completed)       Recommended follow up:  No follow-ups on file. Future Appointments  Date Time Provider [place]/2024  2:45 PM KowalRalene BatheASC-[facility] None    Lab/Order associations:fasting   HudneJeanie Sewer

## 2022-03-05 NOTE — Assessment & Plan Note (Addendum)
chronic reports having 2-3 per week, sometimes bad headaches sending refill of Propranolol 32m ER pt reports taking Topamax in past which worked for her but she is taking low dose in Qsymia f/u 6 mos

## 2022-03-05 NOTE — Progress Notes (Deleted)
Patient ID: Nicole David, female    DOB: [birth year], 47 y.o.   MRN: [id number]  No chief complaint on file.   HPI: Migraines:  Obesity:   Assessment & Plan:   Problem List Items Addressed This Visit       Cardiovascular and Mediastinum   Chronic migraine without aura without status migrainosus, not intractable - Primary    Subjective:    Outpatient Medications Prior to Visit  Medication Sig Dispense Refill  . buPROPion ER (WELLBUTRIN SR) 100 MG 12 hr tablet TAKE 1 TABLET(100 MG) BY MOUTH TWICE DAILY. START WITH 1 PILL IN THE MORNING FOR 3-4 DAYS, THEN. INCREASE TO 2 TIMES A DAY 60 tablet 0  . Cholecalciferol (VITAMIN D3) 1.25 MG (50000 UT) CAPS Take 1 capsule by mouth once a week.    . cyclobenzaprine (FLEXERIL) 10 MG tablet Take 1 tablet (10 mg total) by mouth 3 (three) times daily as needed for muscle spasms. 60 tablet 2  . escitalopram (LEXAPRO) 20 MG tablet Take 1 tablet (20 mg total) by mouth daily. 90 tablet 1  . eszopiclone (LUNESTA) 2 MG TABS tablet TAKE 1 TABLET BY MOUTH IMMEDIATELY BEFORE BEDTIME AS NEEDED 30 tablet 2  . famotidine (PEPCID) 40 MG tablet TAKE 1 TABLET(40 MG) BY MOUTH DAILY 90 tablet 1  . fluticasone (FLONASE) 50 MCG/ACT nasal spray SHAKE LIQUID AND USE 2 SPRAYS IN EACH NOSTRIL EVERY DAY 16 g 0  . furosemide (LASIX) 20 MG tablet Take 1 tablet (20 mg total) by mouth daily. 30 tablet 2  . gabapentin (NEURONTIN) 300 MG capsule Take 1 capsule (300 mg total) by mouth 3 (three) times daily as needed. 90 capsule 2  . loratadine (CLARITIN) 10 MG tablet Take by mouth.    Marland Kitchen LORazepam (ATIVAN) 0.5 MG tablet Take 1 tablet (0.5 mg total) by mouth daily as needed for anxiety. 30 tablet 0  . meloxicam (MOBIC) 7.5 MG tablet Take 1 tablet (7.5 mg total) by mouth daily. OK to increase to 2 pills daily or 1 pill bid if needed. 45 tablet 2  . montelukast (SINGULAIR) 10 MG tablet Take 10 mg by mouth daily.    Marland Kitchen omeprazole (PRILOSEC) 40 MG capsule TAKE 1 CAPSULE(40  MG) BY MOUTH DAILY 90 capsule 1  . Phentermine-Topiramate 3.75-23 MG CP24 Take 1 capsule by mouth daily after breakfast. 14 capsule 0  . Phentermine-Topiramate 7.5-46 MG CP24 Take 1 capsule by mouth daily after breakfast. 30 capsule 0  . Prenatal Vit-Iron Carbonyl-FA (THRIVITE RX) 29-1 MG TABS Take 1 tablet by mouth daily. 30 tablet 5  . propranolol ER (INDERAL LA) 60 MG 24 hr capsule Take 1 capsule (60 mg total) by mouth daily. 30 capsule 5   No facility-administered medications prior to visit.   Past Medical History:  Diagnosis Date  . Anemia   . Anxiety   . Depression   . GERD (gastroesophageal reflux disease)   . Hiatal hernia   . History of palpitations (05-31-2020  pt denies any issues)   pt has work-up including ekg and event monitor done 07/ 2021, ekg okay and montior showed SR -- ST with rare PAC/ PVC (results in care everhwhere  . Infertility, female   . Insomnia   . Migraines   . Wears glasses    Past Surgical History:  Procedure Laterality Date  . DILATION AND EVACUATION N/A 06/04/2020   Procedure: DILATATION AND EVACUATION;  Surgeon: Allyn Kenner, DO;  Location: [place];  Service: Gynecology;  Laterality: N/A;  Wants Chromosome studies done  . DILATION AND EVACUATION N/A 08/05/2020   Procedure: DILATATION AND EVACUATION;  Surgeon: Allyn Kenner, DO;  Location: [place];  Service: Gynecology;  Laterality: N/A;  . ESOPHAGOGASTRODUODENOSCOPY  08/2019  . MICROTUBOPLASTY  2014   bilateral tubal reversal  . OVUM / OOCYTE RETRIEVAL  x2  2013  . TUBAL LIGATION Bilateral 2001  . WISDOM TOOTH EXTRACTION     Allergies  Allergen Reactions  . Sulfa Antibiotics Nausea And Vomiting and Rash  . Zithromax [Azithromycin] Rash  . Zyrtec [Cetirizine] Rash      Objective:    Physical Exam Vitals and nursing note reviewed.  Constitutional:      Appearance: Normal appearance.  Cardiovascular:     Rate and Rhythm: Normal rate and regular  rhythm.  Pulmonary:     Effort: Pulmonary effort is normal.     Breath sounds: Normal breath sounds.  Musculoskeletal:        General: Normal range of motion.  Skin:    General: Skin is warm and dry.  Neurological:     Mental Status: She is alert.  Psychiatric:        Mood and Affect: Mood normal.        Behavior: Behavior normal.  There were no vitals taken for this visit. Wt Readings from Last 3 Encounters:  12/17/21 192 lb 4 oz (87.2 kg)  12/15/21 198 lb 2 oz (89.9 kg)  12/09/21 191 lb (86.6 kg)       Jeanie Sewer, NP

## 2022-03-09 NOTE — Progress Notes (Signed)
All of your labs look pretty good.  Glucose (sugar), electrolytes, kidney and liver function, thyroid, and cholesterol numbers are all good.  Just your bad cholesterol (LDL) is a little high, just watch the saturated fat in diet: red meat, dark chicken meat, sausage, bacon, & dairy foods. Keep up the good work with controlling your diet overall and continue to try and shoot for 30 minutes of exercise daily!

## 2022-03-31 ENCOUNTER — Ambulatory Visit: Payer: Commercial Managed Care - HMO | Admitting: Family

## 2022-03-31 ENCOUNTER — Encounter: Payer: Self-pay | Admitting: Family

## 2022-03-31 VITALS — BP 136/80 | HR 56 | Temp 97.3°F | Ht 62.0 in | Wt 191.8 lb

## 2022-03-31 DIAGNOSIS — N926 Irregular menstruation, unspecified: Secondary | ICD-10-CM

## 2022-03-31 NOTE — Progress Notes (Signed)
Patient ID: Nicole David, female    DOB: Aug 21, 1975, 47 y.o.   MRN: [id number]  Chief Complaint  Patient presents with   Menstrual Problem    Pt c/o cycle March 7th- March 15 But started again yesterday.     HPI:      Irregular menses:   reports her cycles have been regular up until this month. She had her normal cycle 3/7-15, completely stopped and then started again yesterday. She reports it seems to be same amount of flow as her first 2 days of cycle that ended last week.   Assessment & Plan:  1. Irregular menses - regular cycles up until this month. Most likely d/t starting peri-menopause. Last 2 meds started Qsymia and Propranolol do not show this as a reported SE, Qsymia is 1-2% risk. Advised pt her cycle could return to normal next month, could possibly not come at all, or could be longer, heavier, more painful. Advised to let me know via MyChart if the latter does happen and I can prescribe medication to control her sx. F/U prn.  Subjective:    Outpatient Medications Prior to Visit  Medication Sig Dispense Refill   buPROPion ER (WELLBUTRIN SR) 100 MG 12 hr tablet TAKE 1 TABLET(100 MG) BY MOUTH TWICE DAILY. START WITH 1 PILL IN THE MORNING FOR 3-4 DAYS, THEN. INCREASE TO 2 TIMES A DAY 60 tablet 0   Cholecalciferol (VITAMIN D3) 1.25 MG (50000 UT) CAPS Take 1 capsule by mouth once a week.     cyclobenzaprine (FLEXERIL) 10 MG tablet Take 1 tablet (10 mg total) by mouth 3 (three) times daily as needed for muscle spasms. 60 tablet 2   escitalopram (LEXAPRO) 20 MG tablet Take 1 tablet (20 mg total) by mouth daily. 90 tablet 1   eszopiclone (LUNESTA) 2 MG TABS tablet TAKE 1 TABLET BY MOUTH IMMEDIATELY BEFORE BEDTIME AS NEEDED 30 tablet 2   famotidine (PEPCID) 40 MG tablet TAKE 1 TABLET(40 MG) BY MOUTH DAILY 90 tablet 1   fluticasone (FLONASE) 50 MCG/ACT nasal spray SHAKE LIQUID AND USE 2 SPRAYS IN EACH NOSTRIL EVERY DAY 16 g 0   furosemide (LASIX) 20 MG tablet Take 1 tablet (20 mg  total) by mouth daily. 30 tablet 2   gabapentin (NEURONTIN) 300 MG capsule Take 1 capsule (300 mg total) by mouth 3 (three) times daily as needed. 90 capsule 2   loratadine (CLARITIN) 10 MG tablet Take by mouth.     LORazepam (ATIVAN) 0.5 MG tablet Take 1 tablet (0.5 mg total) by mouth daily as needed for anxiety. 30 tablet 0   meloxicam (MOBIC) 7.5 MG tablet Take 1 tablet (7.5 mg total) by mouth daily. OK to increase to 2 pills daily or 1 pill bid if needed. 45 tablet 2   montelukast (SINGULAIR) 10 MG tablet Take 10 mg by mouth daily.     omeprazole (PRILOSEC) 40 MG capsule TAKE 1 CAPSULE(40 MG) BY MOUTH DAILY 90 capsule 1   Phentermine-Topiramate 11.25-69 MG CP24 Take 1 tablet by mouth daily at 12 noon. 30 capsule 2   Prenatal Vit-Iron Carbonyl-FA (THRIVITE RX) 29-1 MG TABS Take 1 tablet by mouth daily. 30 tablet 5   propranolol ER (INDERAL LA) 60 MG 24 hr capsule Take 1 capsule (60 mg total) by mouth daily. 30 capsule 5   No facility-administered medications prior to visit.   Past Medical History:  Diagnosis Date   Anemia    Anxiety    Depression  GERD (gastroesophageal reflux disease)    Hiatal hernia    History of palpitations (05-31-2020  pt denies any issues)   pt has work-up including ekg and event monitor done 07/ 2021, ekg okay and montior showed SR -- ST with rare PAC/ PVC (results in care everhwhere   Infertility, female    Insomnia    Migraines    Wears glasses    Past Surgical History:  Procedure Laterality Date   DILATION AND EVACUATION N/A 06/04/2020   Procedure: DILATATION AND EVACUATION;  Surgeon: Allyn Kenner, DO;  Location: [facility];  Service: Gynecology;  Laterality: N/A;  Wants Chromosome studies done   DILATION AND EVACUATION N/A 08/05/2020   Procedure: DILATATION AND EVACUATION;  Surgeon: Allyn Kenner, DO;  Location: [facility];  Service: Gynecology;  Laterality: N/A;   ESOPHAGOGASTRODUODENOSCOPY  08/2019    MICROTUBOPLASTY  2014   bilateral tubal reversal   OVUM / OOCYTE RETRIEVAL  x2  2013   TUBAL LIGATION Bilateral 2001   WISDOM TOOTH EXTRACTION     Allergies  Allergen Reactions   Sulfa Antibiotics Nausea And Vomiting and Rash   Zithromax [Azithromycin] Rash   Zyrtec [Cetirizine] Rash      Objective:    Physical Exam Vitals and nursing note reviewed.  Constitutional:      Appearance: Normal appearance.  Cardiovascular:     Rate and Rhythm: Normal rate and regular rhythm.  Pulmonary:     Effort: Pulmonary effort is normal.     Breath sounds: Normal breath sounds.  Musculoskeletal:        General: Normal range of motion.  Skin:    General: Skin is warm and dry.  Neurological:     Mental Status: She is alert.  Psychiatric:        Mood and Affect: Mood normal.        Behavior: Behavior normal.    BP 136/80 (BP Location: Left Arm, Patient Position: Sitting, Cuff Size: Large)   Pulse (!) 56   Temp (!) 97.3 F (36.3 C) (Temporal)   Ht 5\' 2"  (1.575 m)   Wt 191 lb 12.8 oz (87 kg)   LMP 03/30/2022 (Exact Date)   SpO2 99%   BMI 35.08 kg/m  Wt Readings from Last 3 Encounters:  03/31/22 191 lb 12.8 oz (87 kg)  03/05/22 191 lb 9.6 oz (86.9 kg)  12/17/21 192 lb 4 oz (87.2 kg)       Jeanie Sewer, NP

## 2022-04-09 ENCOUNTER — Other Ambulatory Visit: Payer: Self-pay | Admitting: Family

## 2022-04-09 DIAGNOSIS — F411 Generalized anxiety disorder: Secondary | ICD-10-CM

## 2022-06-10 ENCOUNTER — Encounter: Payer: Self-pay | Admitting: Family

## 2022-06-12 MED ORDER — LORAZEPAM 0.5 MG PO TABS: 0.5000 mg | ORAL_TABLET | Freq: Every day | ORAL | 0 refills | Status: DC | PRN

## 2022-06-17 ENCOUNTER — Other Ambulatory Visit: Payer: Self-pay | Admitting: Family

## 2022-06-17 DIAGNOSIS — F411 Generalized anxiety disorder: Secondary | ICD-10-CM

## 2022-06-17 MED ORDER — LORAZEPAM 0.5 MG PO TABS: 0.5000 mg | ORAL_TABLET | Freq: Every day | ORAL | 0 refills | Status: DC | PRN

## 2022-06-17 NOTE — Telephone Encounter (Signed)
RX resent, thx

## 2022-06-25 ENCOUNTER — Ambulatory Visit: Payer: Commercial Managed Care - HMO | Admitting: Physician Assistant

## 2022-06-25 VITALS — BP 122/78 | HR 64 | Temp 97.7°F | Ht 62.0 in | Wt 186.6 lb

## 2022-06-25 DIAGNOSIS — R599 Enlarged lymph nodes, unspecified: Secondary | ICD-10-CM

## 2022-06-25 DIAGNOSIS — R519 Headache, unspecified: Secondary | ICD-10-CM | POA: Diagnosis not present

## 2022-06-25 DIAGNOSIS — L989 Disorder of the skin and subcutaneous tissue, unspecified: Secondary | ICD-10-CM

## 2022-06-25 LAB — CBC WITH DIFFERENTIAL/PLATELET
Basophils Absolute: 0.1 10*3/uL (ref 0.0–0.1)
Basophils Relative: 0.5 % (ref 0.0–3.0)
Eosinophils Absolute: 0.3 10*3/uL (ref 0.0–0.7)
Eosinophils Relative: 2.9 % (ref 0.0–5.0)
HCT: 41.5 % (ref 36.0–46.0)
Hemoglobin: 13.9 g/dL (ref 12.0–15.0)
Lymphocytes Relative: 17 % (ref 12.0–46.0)
Lymphs Abs: 1.7 10*3/uL (ref 0.7–4.0)
MCHC: 33.4 g/dL (ref 30.0–36.0)
MCV: 97 fl (ref 78.0–100.0)
Monocytes Absolute: 0.5 10*3/uL (ref 0.1–1.0)
Monocytes Relative: 4.9 % (ref 3.0–12.0)
Neutro Abs: 7.4 10*3/uL (ref 1.4–7.7)
Neutrophils Relative %: 74.7 % (ref 43.0–77.0)
Platelets: 179 10*3/uL (ref 150.0–400.0)
RBC: 4.28 Mil/uL (ref 3.87–5.11)
RDW: 14.3 % (ref 11.5–15.5)
WBC: 9.9 10*3/uL (ref 4.0–10.5)

## 2022-06-25 MED ORDER — CLOBETASOL PROPIONATE 0.05 % EX SOLN: 1.0000 | Freq: Two times a day (BID) | CUTANEOUS | 0 refills | Status: DC

## 2022-06-25 NOTE — Patient Instructions (Addendum)
Good to meet you today! Labs today - will let you know of results.  Please keep appointment with dermatology.  I don't see anything to biopsy today.  You may try the clobetasol shampoo if area of itching flares up.  Continue to monitor.  You will be contacted to schedule brain MRI due to change in headaches. Back to ER if suddenly severe or worse. F/up with PCP / neurology as needed.

## 2022-06-25 NOTE — Progress Notes (Signed)
Subjective:    Patient ID: Nicole David, female    DOB: [birth year], 47 y.o.   MRN: [id number]  Chief Complaint  Patient presents with   Rash    Pt in office to have scalp/ skin abnormality looked at for biopsy;sometimes it changes becoming itchy, appears crusty, also having swollen lymph node behind right ear.     HPI Patient is in today for a few concerns.  Area on scalp - crusts and itches occasionally, would like to know if needs biopsy. Going on since Dec. Wonders if it was a tick bite - found a dead tick in her sheets.   Some lymph nodes have been swelling behind right ear and back of scalp.  Increased frequency / changed intensity of headaches very bothersome to her. Mostly R sided. Had ED visit on 06/06/22 b/c pain was so bad, wanted a scan, didn't happen. Headaches waking her up at night and in the mornings sometimes.   Past Medical History:  Diagnosis Date   Anemia    Anxiety    Depression    GERD (gastroesophageal reflux disease)    Hiatal hernia    History of palpitations (05-31-2020  pt denies any issues)   pt has work-up including ekg and event monitor done 07/ 2021, ekg okay and montior showed SR -- ST with rare PAC/ PVC (results in care everhwhere   Infertility, female    Insomnia    Migraines    Wears glasses     Past Surgical History:  Procedure Laterality Date   DILATION AND EVACUATION N/A 06/04/2020   Procedure: DILATATION AND EVACUATION;  Surgeon: Philip Aspen, DO;  Location: [facility];  Service: Gynecology;  Laterality: N/A;  Wants Chromosome studies done   DILATION AND EVACUATION N/A 08/05/2020   Procedure: DILATATION AND EVACUATION;  Surgeon: Philip Aspen, DO;  [facility];  Service: Gynecology;  Laterality: N/A;   ESOPHAGOGASTRODUODENOSCOPY  08/2019   MICROTUBOPLASTY  2014   bilateral tubal reversal   OVUM / OOCYTE RETRIEVAL  x2  2013   TUBAL LIGATION Bilateral 2001   WISDOM TOOTH EXTRACTION       No family history on file.  Social History   Tobacco Use   Smoking status: Never   Smokeless tobacco: Never  Vaping Use   Vaping Use: Never used  Substance Use Topics   Alcohol use: Yes    Alcohol/week: 2.0 standard drinks of alcohol    Types: 2 Glasses of wine per week   Drug use: Never     Allergies  Allergen Reactions   Sulfa Antibiotics Nausea And Vomiting and Rash   Zithromax [Azithromycin] Rash   Zyrtec [Cetirizine] Rash    Review of Systems NEGATIVE UNLESS OTHERWISE INDICATED IN HPI      Objective:     BP 122/78 (BP Location: Left Arm)   Pulse 64   Temp 97.7 F (36.5 C) (Temporal)   Ht 5\' 2"  (1.575 m)   Wt 186 lb 9.6 oz (84.6 kg)   SpO2 98%   BMI 34.13 kg/m   Wt Readings from Last 3 Encounters:  06/25/22 186 lb 9.6 oz (84.6 kg)  03/31/22 191 lb 12.8 oz (87 kg)  03/05/22 191 lb 9.6 oz (86.9 kg)    BP Readings from Last 3 Encounters:  06/25/22 122/78  03/31/22 136/80  03/05/22 120/77     Physical Exam Vitals and nursing note reviewed.  Constitutional:      Appearance: Normal appearance.  Cardiovascular:     Rate and Rhythm: Normal rate and regular rhythm.     Pulses: Normal pulses.  Pulmonary:     Effort: Pulmonary effort is normal.     Breath sounds: Normal breath sounds.  Skin:    Findings: No lesion or rash.  Neurological:     General: No focal deficit present.     Mental Status: She is oriented to person, place, and time. Mental status is at baseline.     Cranial Nerves: No cranial nerve deficit.     Motor: No weakness.     Gait: Gait normal.  Psychiatric:        Mood and Affect: Mood normal.        Behavior: Behavior normal.        Assessment & Plan:  Increased frequency of headaches -     CBC with Differential/Platelet -     Lyme Disease Serology w/Reflex -     MR BRAIN WO CONTRAST; Future  Right-sided headache -     MR BRAIN WO CONTRAST; Future  Skin lesion of scalp -     CBC with Differential/Platelet -      Lyme Disease Serology w/Reflex  Lymph node enlargement -     CBC with Differential/Platelet -     Lyme Disease Serology w/Reflex  Other orders -     Clobetasol Propionate; Apply 1 Application topically 2 (two) times daily. Do not use more than 2 weeks consistently.  Dispense: 50 mL; Refill: 0    Labs today - will let you know of results.  Please keep appointment with dermatology.  I don't see anything to biopsy today.  You may try the clobetasol shampoo if area of itching flares up.  Continue to monitor.  You will be contacted to schedule brain MRI due to change in headaches. Back to ER if suddenly severe or worse. F/up with PCP / neurology as needed.     Return if symptoms worsen or fail to improve.    Reema Chick M Wilford Merryfield, PA-C

## 2022-06-26 LAB — LYME DISEASE SEROLOGY W/REFLEX: Lyme Total Antibody EIA: NEGATIVE

## 2022-07-03 ENCOUNTER — Ambulatory Visit
Admission: RE | Admit: 2022-07-03 | Discharge: 2022-07-03 | Disposition: A | Discharge status: Home / Self Care | Payer: Commercial Managed Care - HMO | Source: Ambulatory Visit | Attending: Physician Assistant | Admitting: Physician Assistant

## 2022-07-03 DIAGNOSIS — R519 Headache, unspecified: Secondary | ICD-10-CM

## 2022-07-08 ENCOUNTER — Other Ambulatory Visit: Payer: Self-pay | Admitting: Family

## 2022-07-08 DIAGNOSIS — F411 Generalized anxiety disorder: Secondary | ICD-10-CM

## 2022-07-13 ENCOUNTER — Ambulatory Visit: Payer: Commercial Managed Care - HMO | Admitting: Physician Assistant

## 2022-09-03 ENCOUNTER — Ambulatory Visit: Payer: Commercial Managed Care - HMO | Admitting: Family

## 2022-09-03 NOTE — Progress Notes (Deleted)
Patient ID: Nicole David, female    DOB: 1975/07/09, 47 y.o.   MRN: [id number]  No chief complaint on file.   HPI:           Assessment & Plan:     Subjective:    Outpatient Medications Prior to Visit  Medication Sig Dispense Refill   buPROPion ER (WELLBUTRIN SR) 100 MG 12 hr tablet TAKE 1 TABLET(100 MG) BY MOUTH TWICE DAILY. START WITH 1 PILL IN THE MORNING FOR 3-4 DAYS, THEN. INCREASE TO 2 TIMES A DAY 60 tablet 0   Cholecalciferol (VITAMIN D3) 1.25 MG (50000 UT) CAPS Take 1 capsule by mouth once a week.     clobetasol (TEMOVATE) 0.05 % external solution Apply 1 Application topically 2 (two) times daily. Do not use more than 2 weeks consistently. 50 mL 0   cyclobenzaprine (FLEXERIL) 10 MG tablet Take 1 tablet (10 mg total) by mouth 3 (three) times daily as needed for muscle spasms. 60 tablet 2   escitalopram (LEXAPRO) 20 MG tablet TAKE 1 TABLET(20 MG) BY MOUTH DAILY 90 tablet 1   eszopiclone (LUNESTA) 2 MG TABS tablet TAKE 1 TABLET BY MOUTH IMMEDIATELY BEFORE BEDTIME AS NEEDED 30 tablet 2   famotidine (PEPCID) 40 MG tablet TAKE 1 TABLET(40 MG) BY MOUTH DAILY 90 tablet 1   fluticasone (FLONASE) 50 MCG/ACT nasal spray SHAKE LIQUID AND USE 2 SPRAYS IN EACH NOSTRIL EVERY DAY 16 g 0   furosemide (LASIX) 20 MG tablet Take 1 tablet (20 mg total) by mouth daily. 30 tablet 2   gabapentin (NEURONTIN) 300 MG capsule Take 1 capsule (300 mg total) by mouth 3 (three) times daily as needed. 90 capsule 2   loratadine (CLARITIN) 10 MG tablet Take by mouth.     LORazepam (ATIVAN) 0.5 MG tablet Take 1 tablet (0.5 mg total) by mouth daily as needed for anxiety. 30 tablet 0   meloxicam (MOBIC) 7.5 MG tablet Take 1 tablet (7.5 mg total) by mouth daily. OK to increase to 2 pills daily or 1 pill bid if needed. 45 tablet 2   montelukast (SINGULAIR) 10 MG tablet Take 10 mg by mouth daily.     omeprazole (PRILOSEC) 40 MG capsule TAKE 1 CAPSULE(40 MG) BY MOUTH DAILY 90 capsule 1   Phentermine-Topiramate  11.25-69 MG CP24 Take 1 tablet by mouth daily at 12 noon. 30 capsule 2   Prenatal Vit-Iron Carbonyl-FA (THRIVITE RX) 29-1 MG TABS Take 1 tablet by mouth daily. 30 tablet 5   propranolol ER (INDERAL LA) 60 MG 24 hr capsule Take 1 capsule (60 mg total) by mouth daily. 30 capsule 5   No facility-administered medications prior to visit.   Past Medical History:  Diagnosis Date   Anemia    Anxiety    Depression    GERD (gastroesophageal reflux disease)    Hiatal hernia    History of palpitations (05-31-2020  pt denies any issues)   pt has work-up including ekg and event monitor done 07/ 2021, ekg okay and montior showed SR -- ST with rare PAC/ PVC (results in care everhwhere   Infertility, female    Insomnia    Migraines    Wears glasses    Past Surgical History:  Procedure Laterality Date   DILATION AND EVACUATION N/A 06/04/2020   Procedure: DILATATION AND EVACUATION;  Surgeon: Philip Aspen, DO;  Location: [facility];  Service: Gynecology;  Laterality: N/A;  Wants Chromosome studies done   DILATION AND EVACUATION N/A 08/05/2020  Procedure: DILATATION AND EVACUATION;  Surgeon: Philip Aspen, DO;  Location: [facility];  Service: Gynecology;  Laterality: N/A;   ESOPHAGOGASTRODUODENOSCOPY  08/2019   MICROTUBOPLASTY  2014   bilateral tubal reversal   OVUM / OOCYTE RETRIEVAL  x2  2013   TUBAL LIGATION Bilateral 2001   WISDOM TOOTH EXTRACTION     Allergies  Allergen Reactions   Sulfa Antibiotics Nausea And Vomiting and Rash   Zithromax [Azithromycin] Rash   Zyrtec [Cetirizine] Rash      Objective:    Physical Exam Vitals and nursing note reviewed.  Constitutional:      Appearance: Normal appearance.  Cardiovascular:     Rate and Rhythm: Normal rate and regular rhythm.  Pulmonary:     Effort: Pulmonary effort is normal.     Breath sounds: Normal breath sounds.  Musculoskeletal:        General: Normal range of motion.  Skin:    General:  Skin is warm and dry.  Neurological:     Mental Status: She is alert.  Psychiatric:        Mood and Affect: Mood normal.        Behavior: Behavior normal.    There were no vitals taken for this visit. Wt Readings from Last 3 Encounters:  06/25/22 186 lb 9.6 oz (84.6 kg)  03/31/22 191 lb 12.8 oz (87 kg)  03/05/22 191 lb 9.6 oz (86.9 kg)       Dulce Sellar, NP

## 2022-09-15 ENCOUNTER — Other Ambulatory Visit: Payer: Self-pay | Admitting: Medical Genetics

## 2022-09-15 DIAGNOSIS — Z006 Encounter for examination for normal comparison and control in clinical research program: Secondary | ICD-10-CM

## 2022-09-18 ENCOUNTER — Telehealth: Payer: Self-pay | Admitting: Family

## 2022-09-18 NOTE — Telephone Encounter (Signed)
Pt has already registered online for [employer] and an order has to be placed. Please advise.

## 2022-09-22 ENCOUNTER — Other Ambulatory Visit: Payer: Self-pay

## 2022-09-22 DIAGNOSIS — Z1379 Encounter for other screening for genetic and chromosomal anomalies: Secondary | ICD-10-CM

## 2022-09-29 LAB — HELIX MOLECULAR SCREEN: Genetic Analysis Overall Interpretation: NEGATIVE

## 2022-09-29 LAB — GENECONNECT MOLECULAR SCREEN

## 2022-10-21 ENCOUNTER — Encounter: Payer: Self-pay | Admitting: Family

## 2022-10-21 NOTE — Telephone Encounter (Signed)
Not seen since March, needs appt, thx

## 2022-10-28 ENCOUNTER — Other Ambulatory Visit: Payer: Self-pay | Admitting: Family

## 2022-10-28 DIAGNOSIS — F411 Generalized anxiety disorder: Secondary | ICD-10-CM

## 2022-11-12 ENCOUNTER — Ambulatory Visit: Payer: Commercial Managed Care - HMO | Admitting: Dermatology

## 2022-12-17 ENCOUNTER — Other Ambulatory Visit: Payer: Self-pay | Admitting: Family

## 2022-12-17 DIAGNOSIS — M51369 Other intervertebral disc degeneration, lumbar region without mention of lumbar back pain or lower extremity pain: Secondary | ICD-10-CM

## 2022-12-17 DIAGNOSIS — M5030 Other cervical disc degeneration, unspecified cervical region: Secondary | ICD-10-CM

## 2023-01-07 ENCOUNTER — Other Ambulatory Visit: Payer: Self-pay | Admitting: Family

## 2023-01-07 DIAGNOSIS — F411 Generalized anxiety disorder: Secondary | ICD-10-CM

## 2023-01-18 ENCOUNTER — Ambulatory Visit: Payer: Commercial Managed Care - HMO | Admitting: Family

## 2023-01-18 NOTE — Progress Notes (Deleted)
 Patient ID: Nicole David, female    DOB: Aug 30, 1975, 48 y.o.   MRN: [id number]  No chief complaint on file. hiatal hernia repair 11/6          Assessment & Plan:   Subjective:    Outpatient Medications Prior to Visit  Medication Sig Dispense Refill   buPROPion  ER (WELLBUTRIN  SR) 100 MG 12 hr tablet TAKE 1 TABLET(100 MG) BY MOUTH TWICE DAILY. START WITH 1 PILL IN THE MORNING FOR 3-4 DAYS, THEN. INCREASE TO 2 TIMES A DAY 60 tablet 0   Cholecalciferol (VITAMIN D3) 1.25 MG (50000 UT) CAPS Take 1 capsule by mouth once a week.     clobetasol  (TEMOVATE ) 0.05 % external solution Apply 1 Application topically 2 (two) times daily. Do not use more than 2 weeks consistently. 50 mL 0   cyclobenzaprine  (FLEXERIL ) 10 MG tablet TAKE 1 TABLET(10 MG) BY MOUTH THREE TIMES DAILY AS NEEDED FOR MUSCLE SPASMS 60 tablet 2   escitalopram  (LEXAPRO ) 20 MG tablet TAKE 1 TABLET(20 MG) BY MOUTH DAILY 90 tablet 1   eszopiclone  (LUNESTA ) 2 MG TABS tablet TAKE 1 TABLET BY MOUTH IMMEDIATELY BEFORE BEDTIME AS NEEDED 30 tablet 2   famotidine  (PEPCID ) 40 MG tablet TAKE 1 TABLET(40 MG) BY MOUTH DAILY 90 tablet 1   fluticasone  (FLONASE ) 50 MCG/ACT nasal spray SHAKE LIQUID AND USE 2 SPRAYS IN EACH NOSTRIL EVERY DAY 16 g 0   furosemide  (LASIX ) 20 MG tablet Take 1 tablet (20 mg total) by mouth daily. 30 tablet 2   gabapentin  (NEURONTIN ) 300 MG capsule Take 1 capsule (300 mg total) by mouth 3 (three) times daily as needed. 90 capsule 2   loratadine (CLARITIN) 10 MG tablet Take by mouth.     LORazepam  (ATIVAN ) 0.5 MG tablet Take 1 tablet (0.5 mg total) by mouth daily as needed for anxiety. 30 tablet 0   meloxicam  (MOBIC ) 7.5 MG tablet TAKE 1 TABLET(7.5 MG) BY MOUTH DAILY. MAY INCREASE TO 2 PILLS DAILY OR 1 PILL 2 TIMES DAILY AS NEEDED 45 tablet 2   montelukast (SINGULAIR) 10 MG tablet Take 10 mg by mouth daily.     omeprazole  (PRILOSEC) 40 MG capsule TAKE 1 CAPSULE(40 MG) BY MOUTH DAILY 90 capsule 1   Phentermine -Topiramate   11.25-69 MG CP24 Take 1 tablet by mouth daily at 12 noon. 30 capsule 2   Prenatal Vit-Iron  Carbonyl-FA (THRIVITE RX) 29-1 MG TABS Take 1 tablet by mouth daily. 30 tablet 5   propranolol  ER (INDERAL  LA) 60 MG 24 hr capsule Take 1 capsule (60 mg total) by mouth daily. 30 capsule 5   No facility-administered medications prior to visit.   Past Medical History:  Diagnosis Date   Anemia    Anxiety    Depression    GERD (gastroesophageal reflux disease)    Hiatal hernia    History of palpitations (05-31-2020  pt denies any issues)   pt has work-up including ekg and event monitor done 07/ 2021, ekg okay and montior showed SR -- ST with rare PAC/ PVC (results in care everhwhere   Infertility, female    Insomnia    Migraines    Wears glasses    Past Surgical History:  Procedure Laterality Date   DILATION AND EVACUATION N/A 06/04/2020   Procedure: DILATATION AND EVACUATION;  Surgeon: Lilton Legions, DO;  Location: [facility];  Service: Gynecology;  Laterality: N/A;  Wants Chromosome studies done   DILATION AND EVACUATION N/A 08/05/2020   Procedure: DILATATION AND EVACUATION;  Surgeon: Lilton Legions, DO;  Location: [facility];  Service: Gynecology;  Laterality: N/A;   ESOPHAGOGASTRODUODENOSCOPY  08/2019   MICROTUBOPLASTY  2014   bilateral tubal reversal   OVUM / OOCYTE RETRIEVAL  x2  2013   TUBAL LIGATION Bilateral 2001   WISDOM TOOTH EXTRACTION     Allergies  Allergen Reactions   Sulfa Antibiotics Nausea And Vomiting and Rash   Zithromax [Azithromycin] Rash   Zyrtec [Cetirizine] Rash      Objective:    Physical Exam Vitals and nursing note reviewed.  Constitutional:      Appearance: Normal appearance.  Cardiovascular:     Rate and Rhythm: Normal rate and regular rhythm.  Pulmonary:     Effort: Pulmonary effort is normal.     Breath sounds: Normal breath sounds.  Musculoskeletal:        General: Normal range of motion.  Skin:    General:  Skin is warm and dry.  Neurological:     Mental Status: She is alert.  Psychiatric:        Mood and Affect: Mood normal.        Behavior: Behavior normal.    There were no vitals taken for this visit. Wt Readings from Last 3 Encounters:  06/25/22 186 lb 9.6 oz (84.6 kg)  03/31/22 191 lb 12.8 oz (87 kg)  03/05/22 191 lb 9.6 oz (86.9 kg)       Lucius Krabbe, NP

## 2023-01-23 ENCOUNTER — Encounter (INDEPENDENT_AMBULATORY_CARE_PROVIDER_SITE_OTHER): Payer: Self-pay

## 2023-01-25 ENCOUNTER — Telehealth: Payer: Self-pay | Admitting: Family

## 2023-01-25 ENCOUNTER — Encounter: Payer: Self-pay | Admitting: Family

## 2023-01-25 ENCOUNTER — Ambulatory Visit: Payer: Commercial Managed Care - HMO | Admitting: Family

## 2023-01-25 VITALS — BP 134/82 | HR 80 | Temp 98.0°F | Ht 62.0 in | Wt 190.8 lb

## 2023-01-25 DIAGNOSIS — Z0000 Encounter for general adult medical examination without abnormal findings: Secondary | ICD-10-CM

## 2023-01-25 DIAGNOSIS — F5101 Primary insomnia: Secondary | ICD-10-CM

## 2023-01-25 DIAGNOSIS — Z114 Encounter for screening for human immunodeficiency virus [HIV]: Secondary | ICD-10-CM | POA: Diagnosis not present

## 2023-01-25 DIAGNOSIS — Z1159 Encounter for screening for other viral diseases: Secondary | ICD-10-CM

## 2023-01-25 DIAGNOSIS — F411 Generalized anxiety disorder: Secondary | ICD-10-CM

## 2023-01-25 DIAGNOSIS — K449 Diaphragmatic hernia without obstruction or gangrene: Secondary | ICD-10-CM

## 2023-01-25 DIAGNOSIS — G2581 Restless legs syndrome: Secondary | ICD-10-CM | POA: Diagnosis not present

## 2023-01-25 DIAGNOSIS — K219 Gastro-esophageal reflux disease without esophagitis: Secondary | ICD-10-CM

## 2023-01-25 MED ORDER — HYDROXYZINE HCL 10 MG PO TABS: 10.0000 mg | ORAL_TABLET | Freq: Three times a day (TID) | ORAL | 2 refills | Status: DC | PRN

## 2023-01-25 MED ORDER — ESZOPICLONE 2 MG PO TABS: ORAL_TABLET | ORAL | 2 refills | Status: AC

## 2023-01-25 NOTE — Telephone Encounter (Signed)
 Pt is not due for a CPE until 03/06/23. But they want to do lab work asap. Please advise.

## 2023-01-25 NOTE — Progress Notes (Signed)
 Patient ID: Nicole David, female    DOB: 04-03-1975, 48 y.o.   MRN: [id number]  Chief Complaint  Patient presents with   Abdominal Pain    Pt c/o center of abdominal pain and loose stools after Hernia repair on 11/18/2022.    Insomnia   Discussed the use of AI scribe software for clinical note transcription with the patient, who gave verbal consent to proceed.  History of Present Illness   The patient, with a history of recent hiatal hernia repair surgery and liver masses, presents with right-sided and central abdominal pain. The patient reports that the pain has been ongoing for a week and is associated with loose stools. The patient has followed up with the surgeon post-surgery and initially had surgical pain which has since resolved. The patient denies any changes in diet and reports that the pain feels like constant cramping or sharp pain, sometimes on her right side. In addition to the abdominal pain, the patient reports symptoms of restless leg syndrome, characterized by a deep ache in her legs that worsens at night and necessitates constant movement for relief. The patient's father also has restless leg syndrome. The patient has been taking gabapentin  for back pain, but reports that it does not alleviate the leg pain and she doesn't think it helps her back pain either. The patient also reports difficulty sleeping, possibly due to anxiety and restlessness. The patient has been on escitalopram  for a long time and questions its effectiveness. The patient has tried bupropion  in the past but did not find it helpful. The patient has also been experiencing symptoms suggestive of hypoglycemia, including hand tremors, feeling hot, and weakness. These symptoms occur a few times a week and seem to improve after eating something sweet. The patient is concerned about these symptoms and is interested in having blood work done.    Assessment & Plan:     Post-operative Hiatal Hernia Repair  - Reports of abdominal pain and loose stools. No evidence of reflux symptoms. -Resume Omeprazole  40mg  daily for 1-2 weeks. -Reduce intake of acidic foods including caffeine. -If no improvement, refer back to Gastroenterology.  Liver Masses - Two masses removed during hernia repair, non-cancerous. Recent right-sided pain. -Check liver enzymes during upcoming physical.  Restless Leg Syndrome - Reports of leg pain and restlessness, particularly before sleep. Gabapentin  300mg  TID not providing relief. -Trial of Gabapentin  600mg  at bedtime for restless legs, stop taking Gabapentin  during day. -If no improvement, consider alternative medication.  Insomnia - Difficulty sleeping due to restless legs and anxiety. -Continue Lunesta  3mg  as needed. -Consider Hydroxyzine  10-20mg  at night for anxiety and to aid sleep (pt has at home) -F/U 3 mos  ADHD-  Reports of difficulty focusing and restlessness. -Provide ADHD screening tool to complete and discuss at next visit.  General Health Maintenance -Schedule physical with fasting labs.      Subjective:    Outpatient Medications Prior to Visit  Medication Sig Dispense Refill   clobetasol  (TEMOVATE ) 0.05 % external solution Apply 1 Application topically 2 (two) times daily. Do not use more than 2 weeks consistently. 50 mL 0   cyclobenzaprine  (FLEXERIL ) 10 MG tablet TAKE 1 TABLET(10 MG) BY MOUTH THREE TIMES DAILY AS NEEDED FOR MUSCLE SPASMS 60 tablet 2   escitalopram  (LEXAPRO ) 20 MG tablet TAKE 1 TABLET(20 MG) BY MOUTH DAILY 90 tablet 1   fluticasone  (FLONASE ) 50 MCG/ACT nasal spray SHAKE LIQUID AND USE 2 SPRAYS IN EACH NOSTRIL EVERY DAY 16 g 0  furosemide  (LASIX ) 20 MG tablet Take 1 tablet (20 mg total) by mouth daily. 30 tablet 2   gabapentin  (NEURONTIN ) 300 MG capsule Take 1 capsule (300 mg total) by mouth 3 (three) times daily as needed. 90 capsule 2   loratadine (CLARITIN) 10 MG tablet Take by mouth.     meloxicam  (MOBIC ) 7.5 MG tablet TAKE  1 TABLET(7.5 MG) BY MOUTH DAILY. MAY INCREASE TO 2 PILLS DAILY OR 1 PILL 2 TIMES DAILY AS NEEDED 45 tablet 2   montelukast (SINGULAIR) 10 MG tablet Take 10 mg by mouth daily.     Prenatal Vit-Iron  Carbonyl-FA (THRIVITE RX) 29-1 MG TABS Take 1 tablet by mouth daily. 30 tablet 5   propranolol  ER (INDERAL  LA) 60 MG 24 hr capsule Take 1 capsule (60 mg total) by mouth daily. 30 capsule 5   buPROPion  ER (WELLBUTRIN  SR) 100 MG 12 hr tablet TAKE 1 TABLET(100 MG) BY MOUTH TWICE DAILY. START WITH 1 PILL IN THE MORNING FOR 3-4 DAYS, THEN. INCREASE TO 2 TIMES A DAY (Patient not taking: Reported on 01/25/2023) 60 tablet 0   Cholecalciferol (VITAMIN D3) 1.25 MG (50000 UT) CAPS Take 1 capsule by mouth once a week. (Patient not taking: Reported on 01/25/2023)     eszopiclone  (LUNESTA ) 2 MG TABS tablet TAKE 1 TABLET BY MOUTH IMMEDIATELY BEFORE BEDTIME AS NEEDED (Patient not taking: Reported on 01/25/2023) 30 tablet 2   famotidine  (PEPCID ) 40 MG tablet TAKE 1 TABLET(40 MG) BY MOUTH DAILY (Patient not taking: Reported on 01/25/2023) 90 tablet 1   LORazepam  (ATIVAN ) 0.5 MG tablet Take 1 tablet (0.5 mg total) by mouth daily as needed for anxiety. (Patient not taking: Reported on 01/25/2023) 30 tablet 0   omeprazole  (PRILOSEC) 40 MG capsule TAKE 1 CAPSULE(40 MG) BY MOUTH DAILY (Patient not taking: Reported on 01/25/2023) 90 capsule 1   Phentermine -Topiramate  11.25-69 MG CP24 Take 1 tablet by mouth daily at 12 noon. (Patient not taking: Reported on 01/25/2023) 30 capsule 2   No facility-administered medications prior to visit.   Past Medical History:  Diagnosis Date   Anemia    Anxiety    Depression    GERD (gastroesophageal reflux disease)    Hiatal hernia    History of palpitations (05-31-2020  pt denies any issues)   pt has work-up including ekg and event monitor done 07/ 2021, ekg okay and montior showed SR -- ST with rare PAC/ PVC (results in care everhwhere   Infertility, female    Insomnia    Migraines    Wears  glasses    Past Surgical History:  Procedure Laterality Date   DILATION AND EVACUATION N/A 06/04/2020   Procedure: DILATATION AND EVACUATION;  Surgeon: Lilton Legions, DO;  Location: [facility];  Service: Gynecology;  Laterality: N/A;  Wants Chromosome studies done   DILATION AND EVACUATION N/A 08/05/2020   Procedure: DILATATION AND EVACUATION;  Surgeon: Lilton Legions, DO;  Location: [facility];  Service: Gynecology;  Laterality: N/A;   ESOPHAGOGASTRODUODENOSCOPY  08/2019   MICROTUBOPLASTY  2014   bilateral tubal reversal   OVUM / OOCYTE RETRIEVAL  x2  2013   TUBAL LIGATION Bilateral 2001   WISDOM TOOTH EXTRACTION     Allergies  Allergen Reactions   Sulfa Antibiotics Nausea And Vomiting and Rash   Zithromax [Azithromycin] Rash   Zyrtec [Cetirizine] Rash      Objective:    Physical Exam Vitals and nursing note reviewed.  Constitutional:      Appearance: Normal  appearance.  Cardiovascular:     Rate and Rhythm: Normal rate and regular rhythm.  Pulmonary:     Effort: Pulmonary effort is normal.     Breath sounds: Normal breath sounds.  Musculoskeletal:        General: Normal range of motion.  Skin:    General: Skin is warm and dry.  Neurological:     Mental Status: She is alert.  Psychiatric:        Mood and Affect: Mood normal.        Behavior: Behavior normal.   BP 134/82 (BP Location: Left Arm, Patient Position: Sitting, Cuff Size: Normal)   Pulse 80   Temp 98 F (36.7 C) (Temporal)   Ht 5' 2 (1.575 m)   Wt 190 lb 12.8 oz (86.5 kg)   LMP 12/31/2022 (Exact Date)   SpO2 96%   BMI 34.90 kg/m  Wt Readings from Last 3 Encounters:  01/25/23 190 lb 12.8 oz (86.5 kg)  06/25/22 186 lb 9.6 oz (84.6 kg)  03/31/22 191 lb 12.8 oz (87 kg)       Lucius Krabbe, NP

## 2023-01-27 DIAGNOSIS — G2581 Restless legs syndrome: Secondary | ICD-10-CM | POA: Insufficient documentation

## 2023-01-27 NOTE — Addendum Note (Signed)
 Addended by: Alvie Jolly on: 01/27/2023 09:18 AM   Modules accepted: Orders

## 2023-01-27 NOTE — Assessment & Plan Note (Signed)
 Pt had surgery to repair the hernia in November. She reports abdominal pain and loose stools. Possible evidence of reflux symptoms. -Resume Omeprazole  40mg  daily for 1-2 weeks. -Reduce intake of acidic foods including caffeine. -If no improvement, refer back to Gastroenterology.

## 2023-01-28 ENCOUNTER — Other Ambulatory Visit (INDEPENDENT_AMBULATORY_CARE_PROVIDER_SITE_OTHER): Payer: Commercial Managed Care - HMO

## 2023-01-28 ENCOUNTER — Encounter: Payer: Self-pay | Admitting: Family

## 2023-01-28 DIAGNOSIS — Z0000 Encounter for general adult medical examination without abnormal findings: Secondary | ICD-10-CM

## 2023-01-28 DIAGNOSIS — Z1159 Encounter for screening for other viral diseases: Secondary | ICD-10-CM

## 2023-01-28 DIAGNOSIS — Z13220 Encounter for screening for lipoid disorders: Secondary | ICD-10-CM | POA: Diagnosis not present

## 2023-01-28 DIAGNOSIS — Z114 Encounter for screening for human immunodeficiency virus [HIV]: Secondary | ICD-10-CM

## 2023-01-28 LAB — COMPREHENSIVE METABOLIC PANEL
ALT: 11 U/L (ref 0–35)
AST: 15 U/L (ref 0–37)
Albumin: 3.9 g/dL (ref 3.5–5.2)
Alkaline Phosphatase: 51 U/L (ref 39–117)
BUN: 6 mg/dL (ref 6–23)
CO2: 26 meq/L (ref 19–32)
Calcium: 8.8 mg/dL (ref 8.4–10.5)
Chloride: 105 meq/L (ref 96–112)
Creatinine, Ser: 0.92 mg/dL (ref 0.40–1.20)
GFR: 74.5 mL/min (ref >60.00)
Glucose, Bld: 100 mg/dL — ABNORMAL HIGH (ref 70–99)
Potassium: 3.7 meq/L (ref 3.5–5.1)
Sodium: 140 meq/L (ref 135–145)
Total Bilirubin: 0.5 mg/dL (ref 0.2–1.2)
Total Protein: 6.8 g/dL (ref 6.0–8.3)

## 2023-01-28 LAB — CBC WITH DIFFERENTIAL/PLATELET
Basophils Absolute: 0.1 10*3/uL (ref 0.0–0.1)
Basophils Relative: 1.2 % (ref 0.0–3.0)
Eosinophils Absolute: 0.2 10*3/uL (ref 0.0–0.7)
Eosinophils Relative: 3.5 % (ref 0.0–5.0)
HCT: 43.6 % (ref 36.0–46.0)
Hemoglobin: 14.7 g/dL (ref 12.0–15.0)
Lymphocytes Relative: 21.9 % (ref 12.0–46.0)
Lymphs Abs: 1.4 10*3/uL (ref 0.7–4.0)
MCHC: 33.7 g/dL (ref 30.0–36.0)
MCV: 96.2 fL (ref 78.0–100.0)
Monocytes Absolute: 0.4 10*3/uL (ref 0.1–1.0)
Monocytes Relative: 6.1 % (ref 3.0–12.0)
Neutro Abs: 4.4 10*3/uL (ref 1.4–7.7)
Neutrophils Relative %: 67.3 % (ref 43.0–77.0)
Platelets: 176 10*3/uL (ref 150.0–400.0)
RBC: 4.54 Mil/uL (ref 3.87–5.11)
RDW: 13.8 % (ref 11.5–15.5)
WBC: 6.6 10*3/uL (ref 4.0–10.5)

## 2023-01-28 LAB — TSH: TSH: 3.39 u[IU]/mL (ref 0.35–5.50)

## 2023-01-28 LAB — LIPID PANEL
Cholesterol: 196 mg/dL (ref 0–200)
HDL: 54.9 mg/dL (ref >39.00)
LDL Cholesterol: 112 mg/dL — ABNORMAL HIGH (ref 0–99)
NonHDL: 141.03
Total CHOL/HDL Ratio: 4
Triglycerides: 143 mg/dL (ref 0.0–149.0)
VLDL: 28.6 mg/dL (ref 0.0–40.0)

## 2023-01-29 LAB — HIV ANTIBODY (ROUTINE TESTING W REFLEX): HIV 1&2 Ab, 4th Generation: NONREACTIVE

## 2023-01-29 LAB — HEPATITIS C ANTIBODY: Hepatitis C Ab: NONREACTIVE

## 2023-01-29 NOTE — Progress Notes (Signed)
I misunderstood, I didn't read correctly that she wanted labs separate from the physical, I thought she just wanted to do both before they were due in Feb. She still needs to schedule the CPE visit now before I review the results. Thx

## 2023-02-01 ENCOUNTER — Encounter: Payer: Commercial Managed Care - HMO | Admitting: Family

## 2023-02-01 NOTE — Progress Notes (Deleted)
Phone [phone]  Subjective:   Patient is a 48 y.o. female presenting for annual physical.    No chief complaint on file.   See problem oriented charting- ROS- full  review of systems was completed and negative except for: what is noted in HPI above.  The following were reviewed and entered/updated in epic: Past Medical History:  Diagnosis Date   Anemia    Anxiety    Depression    GERD (gastroesophageal reflux disease)    Hiatal hernia    History of palpitations (05-31-2020  pt denies any issues)   pt has work-up including ekg and event monitor done 07/ 2021, ekg okay and montior showed SR -- ST with rare PAC/ PVC (results in care everhwhere   Infertility, female    Insomnia    Migraines    Wears glasses    Patient Active Problem List   Diagnosis Date Noted   Restless legs syndrome 01/27/2023   Generalized anxiety disorder 12/15/2021   Mixed hyperlipidemia 11/20/2021   Abnormal weight gain 11/20/2021   Fluid retention in legs 10/21/2021   DDD (degenerative disc disease), cervical 08/20/2021   Allergic rhinitis 08/20/2021   Hiatal hernia with gastroesophageal reflux 08/20/2021   DDD (degenerative disc disease), lumbar 08/20/2021   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   Vitamin D deficiency 06/10/2018   Vitamin B12 deficiency 06/10/2018   Chronic migraine without aura without status migrainosus, not intractable 10/29/2010   Insomnia 09/16/2010   Past Surgical History:  Procedure Laterality Date   DILATION AND EVACUATION N/A 06/04/2020   Procedure: DILATATION AND EVACUATION;  Surgeon: Philip Aspen, DO;  Location: [facility];  Service: Gynecology;  Laterality: N/A;  Wants Chromosome studies done   DILATION AND EVACUATION N/A 08/05/2020   Procedure: DILATATION AND EVACUATION;  Surgeon: Philip Aspen, DO;  Location: [facility];  Service: Gynecology;  Laterality: N/A;   ESOPHAGOGASTRODUODENOSCOPY  08/2019   MICROTUBOPLASTY   2014   bilateral tubal reversal   OVUM / OOCYTE RETRIEVAL  x2  2013   TUBAL LIGATION Bilateral 2001   WISDOM TOOTH EXTRACTION      No family history on file.  Medications- reviewed and updated Current Outpatient Medications  Medication Sig Dispense Refill   Cholecalciferol (VITAMIN D3) 1.25 MG (50000 UT) CAPS Take 1 capsule by mouth once a week. (Patient not taking: Reported on 01/25/2023)     clobetasol (TEMOVATE) 0.05 % external solution Apply 1 Application topically 2 (two) times daily. Do not use more than 2 weeks consistently. 50 mL 0   cyclobenzaprine (FLEXERIL) 10 MG tablet TAKE 1 TABLET(10 MG) BY MOUTH THREE TIMES DAILY AS NEEDED FOR MUSCLE SPASMS 60 tablet 2   escitalopram (LEXAPRO) 20 MG tablet TAKE 1 TABLET(20 MG) BY MOUTH DAILY 90 tablet 1   eszopiclone (LUNESTA) 2 MG TABS tablet TAKE 1 TABLET BY MOUTH IMMEDIATELY BEFORE BEDTIME AS NEEDED 30 tablet 2   fluticasone (FLONASE) 50 MCG/ACT nasal spray SHAKE LIQUID AND USE 2 SPRAYS IN EACH NOSTRIL EVERY DAY 16 g 0   furosemide (LASIX) 20 MG tablet Take 1 tablet (20 mg total) by mouth daily. 30 tablet 2   gabapentin (NEURONTIN) 300 MG capsule Take 1 capsule (300 mg total) by mouth 3 (three) times daily as needed. 90 capsule 2   hydrOXYzine (ATARAX) 10 MG tablet Take 1-2 tablets (10-20 mg total) by mouth 3 (three) times daily as needed for anxiety. and 60 tablet 2   loratadine (CLARITIN) 10 MG tablet Take by  mouth.     meloxicam (MOBIC) 7.5 MG tablet TAKE 1 TABLET(7.5 MG) BY MOUTH DAILY. MAY INCREASE TO 2 PILLS DAILY OR 1 PILL 2 TIMES DAILY AS NEEDED 45 tablet 2   montelukast (SINGULAIR) 10 MG tablet Take 10 mg by mouth daily.     omeprazole (PRILOSEC) 40 MG capsule TAKE 1 CAPSULE(40 MG) BY MOUTH DAILY (Patient not taking: Reported on 01/25/2023) 90 capsule 1   Prenatal Vit-Iron Carbonyl-FA (THRIVITE RX) 29-1 MG TABS Take 1 tablet by mouth daily. 30 tablet 5   propranolol ER (INDERAL LA) 60 MG 24 hr capsule Take 1 capsule (60 mg total) by  mouth daily. 30 capsule 5   No current facility-administered medications for this visit.    Allergies-reviewed and updated Allergies  Allergen Reactions   Sulfa Antibiotics Nausea And Vomiting and Rash   Zithromax [Azithromycin] Rash   Zyrtec [Cetirizine] Rash    Social History   Social History Narrative   Not on file    Objective:  LMP 12/31/2022 (Exact Date)  Physical Exam Vitals and nursing note reviewed.  Constitutional:      Appearance: Normal appearance.  HENT:     Head: Normocephalic.     Right Ear: Tympanic membrane normal.     Left Ear: Tympanic membrane normal.     Nose: Nose normal.     Mouth/Throat:     Mouth: Mucous membranes are moist.  Eyes:     Pupils: Pupils are equal, round, and reactive to light.  Cardiovascular:     Rate and Rhythm: Normal rate and regular rhythm.  Pulmonary:     Effort: Pulmonary effort is normal.     Breath sounds: Normal breath sounds.  Musculoskeletal:        General: Normal range of motion.     Cervical back: Normal range of motion.  Lymphadenopathy:     Cervical: No cervical adenopathy.  Skin:    General: Skin is warm and dry.  Neurological:     Mental Status: She is alert.  Psychiatric:        Mood and Affect: Mood normal.        Behavior: Behavior normal.       Assessment and Plan   Health Maintenance counseling: 1. Anticipatory guidance: Patient counseled regarding regular dental exams q6 months, eye exams,  avoiding smoking and second hand smoke, limiting alcohol to 1 beverage per day, no illicit drugs.   2. Risk factor reduction:  Advised patient of need for regular exercise and diet rich with fruits and vegetables to reduce risk of heart attack and stroke. Exercise- ***.  Wt Readings from Last 3 Encounters:  01/25/23 190 lb 12.8 oz (86.5 kg)  06/25/22 186 lb 9.6 oz (84.6 kg)  03/31/22 191 lb 12.8 oz (87 kg)   3. Immunizations/screenings/ancillary studies Immunization History  Administered Date(s)  Administered   Moderna Sars-Covid-2 Vaccination 05/08/2019, 06/05/2019   Td (Adult), 2 Lf Tetanus Toxid, Preservative Free 01/12/2005   Tdap 05/13/2015   Health Maintenance Due  Topic Date Due   Colonoscopy  Never done    4. Cervical cancer screening- *** 5. Breast cancer screening-  mammogram *** 6. Colon cancer screening - *** 7. Skin cancer screening- advised regular sunscreen use. Denies worrisome, changing, or new skin lesions.  8. Birth control/STD check- *** 9. Osteoporosis screening- *** 10. Alcohol screening: *** 11. Smoking associated screening (lung cancer screening, AAA screen 65-75, UA)- *** smoker- ***ppd  There are no diagnoses linked to this encounter.  Recommended follow up: ***No follow-ups on file. Future Appointments  Date Time Provider Department Center  02/01/2023  1:20 PM Dulce Sellar, NP [facility]    Lab/Order associations:fasting   Dulce Sellar, NP

## 2023-02-08 NOTE — Assessment & Plan Note (Signed)
Reports of leg pain and restlessness, particularly before sleep. Gabapentin 300mg  TID not providing relief. -Trial of Gabapentin 600mg  at bedtime for restless legs, stop taking Gabapentin during day. -If no improvement, consider alternative medication.

## 2023-02-08 NOTE — Assessment & Plan Note (Signed)
Difficulty sleeping due to restless legs and anxiety. -Continue Lunesta 3mg  as needed. -Consider Hydroxyzine 10mg  at night for anxiety and to aid sleep. -F/U 3 mos

## 2023-02-23 ENCOUNTER — Telehealth: Payer: Self-pay | Admitting: Family

## 2023-02-23 NOTE — Telephone Encounter (Signed)
LVM to clarify appt. Pt schedule for a general well check. Too early for CPE

## 2023-02-25 ENCOUNTER — Ambulatory Visit: Payer: Commercial Managed Care - HMO | Admitting: Family

## 2023-02-25 ENCOUNTER — Encounter: Payer: Self-pay | Admitting: Family

## 2023-02-25 ENCOUNTER — Other Ambulatory Visit: Payer: Self-pay | Admitting: Family

## 2023-02-25 VITALS — BP 126/78 | HR 65 | Temp 96.8°F | Ht 62.0 in | Wt 188.6 lb

## 2023-02-25 DIAGNOSIS — Z0001 Encounter for general adult medical examination with abnormal findings: Secondary | ICD-10-CM | POA: Diagnosis not present

## 2023-02-25 DIAGNOSIS — R109 Unspecified abdominal pain: Secondary | ICD-10-CM

## 2023-02-25 DIAGNOSIS — E559 Vitamin D deficiency, unspecified: Secondary | ICD-10-CM | POA: Diagnosis not present

## 2023-02-25 DIAGNOSIS — Z1231 Encounter for screening mammogram for malignant neoplasm of breast: Secondary | ICD-10-CM

## 2023-02-25 DIAGNOSIS — D1771 Benign lipomatous neoplasm of kidney: Secondary | ICD-10-CM

## 2023-02-25 DIAGNOSIS — R1011 Right upper quadrant pain: Secondary | ICD-10-CM | POA: Diagnosis not present

## 2023-02-25 NOTE — Progress Notes (Signed)
Phone [phone]  Subjective:   Patient is a 48 y.o. female presenting for annual physical.    Chief Complaint  Patient presents with   Flank Pain    Pt c/o right sided flank pain around to abdomen. Pt would like a kidney scan to check tumor.    Annual Exam   History of Present Illness   Nicole David is a 48 year old female who presents for a physical and with severe right-sided abdominal pain. She is accompanied by her partner who provides additional information about her symptoms.  She experiences severe right-sided abdominal pain that began after her hiatal hernia repair. The pain is persistent at a 4-5/10 on pain scale which is all the time, and when severe, it is rated 7 to 8 out of 10, which is usually with any physical activity even walking in a store. It limits her ability to walk for more than 20 minutes and necessitates lying down daily in the afternoon. She finds some relief with ibuprofen and gabapentin. Post-surgery, she also has noticed a lump in her lower throat, described as a band-like structure that is tender to touch, which was not present before her surgery. She also reports occasional difficulty swallowing, attributing it to surgical changes in her esophagus. She maintains a regular menstrual cycle without significant changes in flow or pain. She is not on hormonal birth control and does not experience hot flashes outside of the episodes associated with her other symptoms. She has a history of an ectopic and molar pregnancies and tubal reversal.   Also since her surgery, she experiences episodes of dizziness, hot flashes, and shaking two to three times a week, sometimes accompanied by nausea. These episodes are unpredictable and can occur at any time. Eating sweet foods can alleviate these symptoms, although she finds this approach undesirable. Her past medical history includes the removal of two benign liver cysts during her hernia surgery and a known kidney tumor  last evaluated over three years ago. She has not experienced any kidney stones in the past, but would like to have the tumor re-evaluated as was recommended to do q3y.     See problem oriented charting- ROS- full  review of systems was completed and negative except for: what is noted in HPI above.  The following were reviewed and entered/updated in epic: Past Medical History:  Diagnosis Date   Anemia    Anxiety    Depression    GERD (gastroesophageal reflux disease)    Hiatal hernia    History of palpitations (05-31-2020  pt denies any issues)   pt has work-up including ekg and event monitor done 07/ 2021, ekg okay and montior showed SR -- ST with rare PAC/ PVC (results in care everhwhere   Infertility, female    Insomnia    Migraines    Wears glasses    Patient Active Problem List   Diagnosis Date Noted   Restless legs syndrome 01/27/2023   Generalized anxiety disorder 12/15/2021   Mixed hyperlipidemia 11/20/2021   Abnormal weight gain 11/20/2021   Fluid retention in legs 10/21/2021   DDD (degenerative disc disease), cervical 08/20/2021   Allergic rhinitis 08/20/2021   Hiatal hernia with gastroesophageal reflux 08/20/2021   DDD (degenerative disc disease), lumbar 08/20/2021   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   Vitamin D deficiency 06/10/2018   Vitamin B12 deficiency 06/10/2018   Chronic migraine without aura without status migrainosus, not intractable 10/29/2010   Insomnia 09/16/2010   Past Surgical History:  Procedure Laterality Date   DILATION AND EVACUATION N/A 06/04/2020   Procedure: DILATATION AND EVACUATION;  Surgeon: Philip Aspen, DO;  Location: [facility];  Service: Gynecology;  Laterality: N/A;  Wants Chromosome studies done   DILATION AND EVACUATION N/A 08/05/2020   Procedure: DILATATION AND EVACUATION;  Surgeon: Philip Aspen, DO;  Location: [facility];  Service: Gynecology;  Laterality: N/A;    ESOPHAGOGASTRODUODENOSCOPY  08/2019   HERNIA REPAIR     MICROTUBOPLASTY  2014   bilateral tubal reversal   OVUM / OOCYTE RETRIEVAL  x2  2013   TUBAL LIGATION Bilateral 2001   WISDOM TOOTH EXTRACTION      No family history on file.  Medications- reviewed and updated Current Outpatient Medications  Medication Sig Dispense Refill   cyclobenzaprine (FLEXERIL) 10 MG tablet TAKE 1 TABLET(10 MG) BY MOUTH THREE TIMES DAILY AS NEEDED FOR MUSCLE SPASMS 60 tablet 2   escitalopram (LEXAPRO) 20 MG tablet TAKE 1 TABLET(20 MG) BY MOUTH DAILY 90 tablet 1   eszopiclone (LUNESTA) 2 MG TABS tablet TAKE 1 TABLET BY MOUTH IMMEDIATELY BEFORE BEDTIME AS NEEDED 30 tablet 2   fluticasone (FLONASE) 50 MCG/ACT nasal spray SHAKE LIQUID AND USE 2 SPRAYS IN EACH NOSTRIL EVERY DAY 16 g 0   furosemide (LASIX) 20 MG tablet Take 1 tablet (20 mg total) by mouth daily. 30 tablet 2   gabapentin (NEURONTIN) 300 MG capsule Take 1 capsule (300 mg total) by mouth 3 (three) times daily as needed. 90 capsule 2   hydrOXYzine (ATARAX) 10 MG tablet Take 1-2 tablets (10-20 mg total) by mouth 3 (three) times daily as needed for anxiety. and 60 tablet 2   loratadine (CLARITIN) 10 MG tablet Take by mouth.     meloxicam (MOBIC) 7.5 MG tablet TAKE 1 TABLET(7.5 MG) BY MOUTH DAILY. MAY INCREASE TO 2 PILLS DAILY OR 1 PILL 2 TIMES DAILY AS NEEDED 45 tablet 2   montelukast (SINGULAIR) 10 MG tablet Take 10 mg by mouth daily.     omeprazole (PRILOSEC) 40 MG capsule TAKE 1 CAPSULE(40 MG) BY MOUTH DAILY 90 capsule 1   Prenatal Vit-Iron Carbonyl-FA (THRIVITE RX) 29-1 MG TABS Take 1 tablet by mouth daily. 30 tablet 5   propranolol ER (INDERAL LA) 60 MG 24 hr capsule Take 1 capsule (60 mg total) by mouth daily. 30 capsule 5   Cholecalciferol (VITAMIN D3) 1.25 MG (50000 UT) CAPS Take 1 capsule by mouth once a week. (Patient not taking: Reported on 02/25/2023)     clobetasol (TEMOVATE) 0.05 % external solution Apply 1 Application topically 2 (two) times  daily. Do not use more than 2 weeks consistently. (Patient not taking: Reported on 02/25/2023) 50 mL 0   No current facility-administered medications for this visit.    Allergies-reviewed and updated Allergies  Allergen Reactions   Sulfa Antibiotics Nausea And Vomiting and Rash   Zithromax [Azithromycin] Rash   Zyrtec [Cetirizine] Rash    Social History   Social History Narrative   Not on file    Objective:  BP 126/78 (BP Location: Left Arm, Patient Position: Sitting, Cuff Size: Normal)   Pulse 65   Temp (!) 96.8 F (36 C) (Temporal)   Ht 5\' 2"  (1.575 m)   Wt 188 lb 9.6 oz (85.5 kg)   LMP 01/31/2023 (Approximate)   SpO2 98%   BMI 34.50 kg/m  Physical Exam Vitals and nursing note reviewed.  Constitutional:      Appearance: Normal appearance.  HENT:  Head: Normocephalic.     Right Ear: Tympanic membrane normal.     Left Ear: Tympanic membrane normal.     Nose: Nose normal.     Mouth/Throat:     Mouth: Mucous membranes are moist.  Eyes:     Pupils: Pupils are equal, round, and reactive to light.  Neck:   Cardiovascular:     Rate and Rhythm: Normal rate and regular rhythm.  Pulmonary:     Effort: Pulmonary effort is normal.     Breath sounds: Normal breath sounds.  Abdominal:     General: There is no distension.     Palpations: Abdomen is soft. There is no hepatomegaly or mass.     Tenderness: There is abdominal tenderness (right side tenderness with palpation) in the right upper quadrant. There is no right CVA tenderness or guarding. Negative signs include Murphy's sign and McBurney's sign.     Hernia: No hernia is present.    Musculoskeletal:        General: Normal range of motion.     Cervical back: Normal range of motion. No erythema. Muscular tenderness (with palpable nodule approx. 1.5cm in diameter and thickened tissue on either side) present. Normal range of motion.  Lymphadenopathy:     Cervical: No cervical adenopathy.  Skin:    General: Skin is  warm and dry.  Neurological:     Mental Status: She is alert.  Psychiatric:        Mood and Affect: Mood normal.        Behavior: Behavior normal.      Assessment and Plan   Health Maintenance counseling: 1. Anticipatory guidance: Patient counseled regarding regular dental exams q6 months, eye exams,  avoiding smoking and second hand smoke, limiting alcohol to 1 beverage per day, no illicit drugs.   2. Risk factor reduction:  Advised patient of need for regular exercise and diet rich with fruits and vegetables to reduce risk of heart attack and stroke. Exercise- rarely.  Wt Readings from Last 3 Encounters:  02/25/23 188 lb 9.6 oz (85.5 kg)  01/25/23 190 lb 12.8 oz (86.5 kg)  06/25/22 186 lb 9.6 oz (84.6 kg)   3. Immunizations/screenings/ancillary studies Immunization History  Administered Date(s) Administered   Moderna Sars-Covid-2 Vaccination 05/08/2019, 06/05/2019   Td (Adult), 2 Lf Tetanus Toxid, Preservative Free 01/12/2005   Tdap 05/13/2015   Health Maintenance Due  Topic Date Due   Colonoscopy  Never done    4. Cervical cancer screening- utd 5. Breast cancer screening-  mammogram- due, pt to schedule 6. Colon cancer screening - never 7. Skin cancer screening- advised regular sunscreen use. Denies worrisome, changing, or new skin lesions.  8. Birth control/STD check-  None 9. Osteoporosis screening- N/A 10. Alcohol screening: rare 11. Smoking associated screening (lung cancer screening, AAA screen 65-75, UA)- never smoked  Assessment and Plan    Right flank/RUQ pain - Daily severe right-sided abdominal pain, limiting mobility and daily activities. Pain onset after surgery, not relieved by increased acid medication. Mild tenderness noted in soft tissue, right side below rib cage w/palpation. Possible GB stones/inflammation.  -Order CT scan to evaluate the abdomen and pelvis, including GB and kidneys. -Recommend follow-up with GI surgeon to discuss these symptoms as  well.  Soft tissue lump - Located anteriorally, lower throat, approx. 1.5cm nodular area w/mild thickened tissue palpable on either side as well. Reports occasional difficulty swallowing. -Recommend follow-up with GI surgeon to discuss, if r/t surgery ruled out, will look at  thyroid scan.  Kidney tumor - Tumor identified three years ago, due for follow-up imaging. -Include kidneys in the ordered CT scan.  Unexplained episodes of dizziness, hot flashes, and shaking - Occurring 2-3 times per week, not clearly associated with abdominal pain. Symptoms include feeling faint, hot flashes, and hand shaking. No fever reported. Pt reports regular menstrual cycles. Recent lab work all wnl. -Advise patient to maintain regular eating and hydration habits. -Advise patient to discuss sx with gynecologist during upcoming visit, discussed possibility of perimenopausal symptoms.  General Health Maintenance - -Check Vitamin D level, pt was on RX Vit D, finished, but did not start OTC supplement. -CPE labs done 2 weeks ago, reviewed today.      Recommended follow up:  No follow-ups on file. Future Appointments  Date Time Provider Department Center  04/21/2023  3:00 PM [facility] Jonathon Jordan, NP

## 2023-02-25 NOTE — Patient Instructions (Addendum)
It was very nice to see you today!   I will review your lab results for the Vitamin D via MyChart in a few days. I have sent over the order for an abdominal & pelvic CT scan and they will call you to schedule. As discussed, talk with the gastroenterologist about the new throat tissue finding and the pain in your right side. Also discuss that the shakiness and nausea is still occurring.      PLEASE NOTE:  If you had any lab tests please let us know if you have not heard back within a few days. You may see your results on MyChart before we have a chance to review them but we will give you a call once they are reviewed by [place]. If we ordered any referrals today, please let us know if you have not heard from their office within the next week.

## 2023-02-26 ENCOUNTER — Encounter: Payer: Self-pay | Admitting: Family

## 2023-02-26 ENCOUNTER — Telehealth: Payer: Self-pay | Admitting: Family

## 2023-02-26 LAB — VITAMIN D 25 HYDROXY (VIT D DEFICIENCY, FRACTURES): VITD: 29.24 ng/mL — ABNORMAL LOW (ref 30.00–100.00)

## 2023-02-26 MED ORDER — VITAMIN D3 1.25 MG (50000 UT) PO CAPS: 1.0000 | ORAL_CAPSULE | ORAL | 3 refills | Status: AC

## 2023-02-26 NOTE — Telephone Encounter (Unsigned)
Copied from CRM [id number]. Topic: Clinical - Prescription Issue >> Feb 26, 2023 11:28 AM Sonny Dandy B wrote: Reason for CRM: Cathlean Cower from [facility] called to request and updated , revised order for CT abdomin only with and without constrast. Renal mass protocol. Please revised the order in Epic. [id number] option 1 , option 4

## 2023-02-26 NOTE — Telephone Encounter (Signed)
new order sent

## 2023-02-26 NOTE — Addendum Note (Signed)
Addended byDulce Sellar on: 02/26/2023 05:29 PM   Modules accepted: Orders

## 2023-02-26 NOTE — Addendum Note (Signed)
Addended byDulce Sellar on: 02/26/2023 05:32 PM   Modules accepted: Orders

## 2023-03-01 ENCOUNTER — Encounter: Payer: Self-pay | Admitting: Family

## 2023-03-09 ENCOUNTER — Encounter: Payer: Self-pay | Admitting: Family

## 2023-03-19 ENCOUNTER — Other Ambulatory Visit: Payer: Self-pay | Admitting: Family

## 2023-03-19 DIAGNOSIS — G43709 Chronic migraine without aura, not intractable, without status migrainosus: Secondary | ICD-10-CM

## 2023-03-25 ENCOUNTER — Other Ambulatory Visit: Payer: Commercial Managed Care - HMO

## 2023-04-03 ENCOUNTER — Other Ambulatory Visit: Payer: Self-pay | Admitting: Family

## 2023-04-03 DIAGNOSIS — M5030 Other cervical disc degeneration, unspecified cervical region: Secondary | ICD-10-CM

## 2023-04-03 DIAGNOSIS — M51369 Other intervertebral disc degeneration, lumbar region without mention of lumbar back pain or lower extremity pain: Secondary | ICD-10-CM

## 2023-04-05 NOTE — Telephone Encounter (Signed)
 Lov:01/25/2023 Next office visit:n/a Quat:90 cap on gab Last refill on gab was 12/15/2021

## 2023-04-21 ENCOUNTER — Ambulatory Visit: Payer: Commercial Managed Care - HMO

## 2023-05-01 ENCOUNTER — Encounter: Payer: Self-pay | Admitting: Family

## 2023-05-05 ENCOUNTER — Encounter: Payer: Self-pay | Admitting: Family

## 2023-05-05 ENCOUNTER — Ambulatory Visit: Admitting: Family

## 2023-05-05 VITALS — BP 136/82 | HR 107 | Temp 97.3°F | Ht 62.0 in | Wt 192.8 lb

## 2023-05-05 DIAGNOSIS — Z8719 Personal history of other diseases of the digestive system: Secondary | ICD-10-CM | POA: Insufficient documentation

## 2023-05-05 DIAGNOSIS — J3089 Other allergic rhinitis: Secondary | ICD-10-CM | POA: Diagnosis not present

## 2023-05-05 DIAGNOSIS — F900 Attention-deficit hyperactivity disorder, predominantly inattentive type: Secondary | ICD-10-CM | POA: Diagnosis not present

## 2023-05-05 DIAGNOSIS — Z9889 Other specified postprocedural states: Secondary | ICD-10-CM

## 2023-05-05 DIAGNOSIS — G43709 Chronic migraine without aura, not intractable, without status migrainosus: Secondary | ICD-10-CM

## 2023-05-05 DIAGNOSIS — F411 Generalized anxiety disorder: Secondary | ICD-10-CM

## 2023-05-05 MED ORDER — LISDEXAMFETAMINE DIMESYLATE 20 MG PO CAPS
20.0000 mg | ORAL_CAPSULE | Freq: Every day | ORAL | 0 refills | Status: DC
Start: 2023-05-05 — End: 2023-06-03

## 2023-05-05 MED ORDER — FLUTICASONE PROPIONATE 50 MCG/ACT NA SUSP: NASAL | 2 refills | Status: AC

## 2023-05-05 MED ORDER — PROPRANOLOL HCL ER 60 MG PO CP24: 60.0000 mg | ORAL_CAPSULE | Freq: Every day | ORAL | 5 refills | Status: AC

## 2023-05-05 NOTE — Progress Notes (Signed)
 Patient ID: FLORNCE RECORD, female    DOB: [birth year], 48 y.o.   MRN: [id number]  Chief Complaint  Patient presents with   ADHD  Discussed the use of AI scribe software for clinical note transcription with the patient, who gave verbal consent to proceed.  History of Present Illness The patient, with a history of liver tumors, an ovarian cyst, abdominal pain and a repaired hiatal hernia, presents for follow up on her ADHD testing. The abdominal pain was initially thought to be related to the gallbladder, but a scan ruled this out. The patient reports that the pain has decreased and is not as frequent. The patient also reports experiencing headaches and a "weird feeling" about thirty minutes after eating. The patient has not identified any specific foods that trigger these symptoms. The patient also reports difficulty focusing and completing tasks, which is affecting her daily life. The patient's back pain is not well controlled with gabapentin , but the medication does seem to help with the headaches. The patient also takes propranolol  for headache prevention, and Flonase  for allergies.  Assessment & Plan Anxiety Current management includes escitalopram  20mg  qd. Discussed in past potential addition of Wellbutrin  for weight gain and efficacy. Gabapentin  helps with anxiety and headaches. - Continue escitalopram  20mg  qd. - Continue propranolol . - Consider adding Wellbutrin . - Ensure hydroxyzine  is filled if needed. - Use gabapentin  as needed. - F/U in 6 mos or prn  New ADHD Discussed starting Vyvanse  for symptoms. Controlled substance contract reviewed, signed, and scanned. - Start Vyvanse  20 mg in the morning, advised on SE. - Monitor blood pressure and heart rate. - Follow-up in 1 month to assess response and adjust dosage if needed. - Educate on proper storage of Vyvanse .  Headaches Occur postprandially, not relieved by current medications. Discussed dietary triggers. States  Gabapentin  does help. - Continue propranolol  60mg  ER qd for prevention. - Use gabapentin  as needed if helping. - Consider dietary modifications to avoid MSG, other preservatives, food dyes, and gluten. - F/U 6 mos or prn  Allergic Rhinitis - Symptoms are worse with the spring allergy season. Flonase  nasal spray has helped sx in the past. - Sending refill of Flonase  nasal spray, squirt 1 spray each nostril bid x 3d, then qd for another month. - F/U prn   Subjective:    Outpatient Medications Prior to Visit  Medication Sig Dispense Refill   Cholecalciferol (VITAMIN D3) 1.25 MG (50000 UT) CAPS Take 1 capsule (1.25 mg total) by mouth once a week. 12 capsule 3   cyclobenzaprine  (FLEXERIL ) 10 MG tablet TAKE 1 TABLET(10 MG) BY MOUTH THREE TIMES DAILY AS NEEDED FOR MUSCLE SPASMS 60 tablet 2   escitalopram  (LEXAPRO ) 20 MG tablet TAKE 1 TABLET(20 MG) BY MOUTH DAILY 90 tablet 1   eszopiclone  (LUNESTA ) 2 MG TABS tablet TAKE 1 TABLET BY MOUTH IMMEDIATELY BEFORE BEDTIME AS NEEDED 30 tablet 2   fluticasone  (FLONASE ) 50 MCG/ACT nasal spray SHAKE LIQUID AND USE 2 SPRAYS IN EACH NOSTRIL EVERY DAY 16 g 0   furosemide  (LASIX ) 20 MG tablet Take 1 tablet (20 mg total) by mouth daily. 30 tablet 2   gabapentin  (NEURONTIN ) 300 MG capsule TAKE 1 CAPSULE(300 MG) BY MOUTH THREE TIMES DAILY AS NEEDED 90 capsule 2   loratadine (CLARITIN) 10 MG tablet Take by mouth.     Prenatal Vit-Iron Carbonyl-FA (THRIVITE RX) 29-1 MG TABS Take 1 tablet by mouth daily. 30 tablet 5   propranolol  ER (INDERAL  LA) 60 MG 24 hr capsule TAKE  1 CAPSULE(60 MG) BY MOUTH DAILY 30 capsule 5   clobetasol  (TEMOVATE ) 0.05 % external solution Apply 1 Application topically 2 (two) times daily. Do not use more than 2 weeks consistently. (Patient not taking: Reported on 05/05/2023) 50 mL 0   hydrOXYzine  (ATARAX ) 10 MG tablet Take 1-2 tablets (10-20 mg total) by mouth 3 (three) times daily as needed for anxiety. and (Patient not taking: Reported on  05/05/2023) 60 tablet 2   meloxicam  (MOBIC ) 7.5 MG tablet TAKE 1 TABLET(7.5 MG) BY MOUTH DAILY. MAY INCREASE TO 2 PILLS DAILY OR 1 PILL 2 TIMES DAILY AS NEEDED (Patient not taking: Reported on 05/05/2023) 45 tablet 2   montelukast (SINGULAIR) 10 MG tablet Take 10 mg by mouth daily. (Patient not taking: Reported on 05/05/2023)     omeprazole  (PRILOSEC) 40 MG capsule TAKE 1 CAPSULE(40 MG) BY MOUTH DAILY (Patient not taking: Reported on 05/05/2023) 90 capsule 1   No facility-administered medications prior to visit.   Past Medical History:  Diagnosis Date   Anemia    Anxiety    Depression    GERD (gastroesophageal reflux disease)    Hiatal hernia    History of palpitations (05-31-2020  pt denies any issues)   pt has work-up including ekg and event monitor done 07/ 2021, ekg okay and montior showed SR -- ST with rare PAC/ PVC (results in care everhwhere   Infertility, female    Insomnia    Migraines    Wears glasses    Past Surgical History:  Procedure Laterality Date   DILATION AND EVACUATION N/A 06/04/2020   Procedure: DILATATION AND EVACUATION;  Surgeon: Atlas Blank, DO;  Location: [facility];  Service: Gynecology;  Laterality: N/A;  Wants Chromosome studies done   DILATION AND EVACUATION N/A 08/05/2020   Procedure: DILATATION AND EVACUATION;  Surgeon: Atlas Blank, DO;  Location: [facility] [place];  Service: Gynecology;  Laterality: N/A;   ESOPHAGOGASTRODUODENOSCOPY  08/2019   HERNIA REPAIR     MICROTUBOPLASTY  2014   bilateral tubal reversal   OVUM / OOCYTE RETRIEVAL  x2  2013   TUBAL LIGATION Bilateral 2001   WISDOM TOOTH EXTRACTION     Allergies  Allergen Reactions   Sulfa Antibiotics Nausea And Vomiting and Rash   Zithromax [Azithromycin] Rash   Zyrtec [Cetirizine] Rash      Objective:    Physical Exam Vitals and nursing note reviewed.  Constitutional:      Appearance: Normal appearance. She is obese.  Cardiovascular:     Rate and  Rhythm: Normal rate and regular rhythm.  Pulmonary:     Effort: Pulmonary effort is normal.     Breath sounds: Normal breath sounds.  Musculoskeletal:        General: Normal range of motion.  Skin:    General: Skin is warm and dry.  Neurological:     Mental Status: She is alert.  Psychiatric:        Mood and Affect: Mood normal.        Behavior: Behavior normal.    BP 136/82 (BP Location: Left Arm, Patient Position: Sitting, Cuff Size: Large)   Pulse (!) 107   Temp (!) 97.3 F (36.3 C) (Temporal)   Ht 5\' 2"  (1.575 m)   Wt 192 lb 12.8 oz (87.5 kg)   LMP 04/22/2023 (Exact Date)   SpO2 97%   BMI 35.26 kg/m  Wt Readings from Last 3 Encounters:  05/05/23 192 lb 12.8 oz (87.5 kg)  02/25/23 188  lb 9.6 oz (85.5 kg)  01/25/23 190 lb 12.8 oz (86.5 kg)      Versa Gore, NP

## 2023-05-05 NOTE — Assessment & Plan Note (Signed)
 Current management includes escitalopram  20mg  qd. Discussed in past potential addition of Wellbutrin  for weight gain and efficacy. Gabapentin  helps with anxiety and headaches. - Continue escitalopram  20mg  qd. - Continue propranolol . - Consider adding Wellbutrin . - Ensure hydroxyzine  is filled if needed. - Use gabapentin  as needed. - F/U in 6 mos or prn

## 2023-05-05 NOTE — Assessment & Plan Note (Signed)
 Discussed starting Vyvanse  for symptoms. Controlled substance contract reviewed, signed, and scanned. - Start Vyvanse  20 mg in the morning, advised on SE. - Monitor blood pressure and heart rate. - Follow-up in 1 month to assess response and adjust dosage if needed. - Educate on proper storage of Vyvanse .

## 2023-05-05 NOTE — Assessment & Plan Note (Signed)
 Symptoms are worse with the spring allergy season. Flonase  nasal spray has helped sx in the past. - Sending refill of Flonase  nasal spray, squirt 1 spray each nostril bid x 3d, then qd for another month. - F/U prn

## 2023-05-10 NOTE — Telephone Encounter (Signed)
 She can take 2 pills each morning and see if any better, needs to come in sooner for follow up as she will run out of pills if working.

## 2023-05-10 NOTE — Telephone Encounter (Signed)
 Please see pt msg and advise

## 2023-05-25 ENCOUNTER — Other Ambulatory Visit: Payer: Self-pay | Admitting: Family

## 2023-05-25 DIAGNOSIS — F411 Generalized anxiety disorder: Secondary | ICD-10-CM

## 2023-05-25 NOTE — Telephone Encounter (Signed)
 No, we'll discuss at her f/u visit next week.

## 2023-06-03 ENCOUNTER — Encounter: Payer: Self-pay | Admitting: Family

## 2023-06-03 ENCOUNTER — Ambulatory Visit: Admitting: Family

## 2023-06-03 VITALS — BP 117/83 | HR 83 | Temp 97.3°F | Ht 62.0 in

## 2023-06-03 DIAGNOSIS — M5136 Other intervertebral disc degeneration, lumbar region with discogenic back pain only: Secondary | ICD-10-CM | POA: Diagnosis not present

## 2023-06-03 DIAGNOSIS — F411 Generalized anxiety disorder: Secondary | ICD-10-CM

## 2023-06-03 DIAGNOSIS — M5030 Other cervical disc degeneration, unspecified cervical region: Secondary | ICD-10-CM

## 2023-06-03 DIAGNOSIS — F900 Attention-deficit hyperactivity disorder, predominantly inattentive type: Secondary | ICD-10-CM | POA: Diagnosis not present

## 2023-06-03 MED ORDER — DEXMETHYLPHENIDATE HCL ER 10 MG PO CP24: 10.0000 mg | ORAL_CAPSULE | ORAL | 0 refills | Status: DC

## 2023-06-03 MED ORDER — GABAPENTIN 300 MG PO CAPS: 300.0000 mg | ORAL_CAPSULE | Freq: Three times a day (TID) | ORAL | 2 refills | Status: DC

## 2023-06-03 MED ORDER — ESCITALOPRAM OXALATE 20 MG PO TABS: 20.0000 mg | ORAL_TABLET | Freq: Every day | ORAL | 1 refills | Status: DC

## 2023-06-03 NOTE — Assessment & Plan Note (Signed)
 Anxiety possibly worsened by ADHD medication wearing off. Also reports fatigue and lack of engagement, possibly due to depression or chronic pain. Discussed impact of pain on mood.On maximum dose of escitalopram . Discussed monitoring irritability when ADHD medication wears off. - Continue escitalopram  at current dose. - Monitor for irritability and anxiety, especially as ADHD medication wears off. - Consider medication adjustment (possibly Cymbalta) or adding prn med if anxiety or irritability persists. - F/U in 3 mos

## 2023-06-03 NOTE — Progress Notes (Signed)
 Patient ID: Nicole David, female    DOB: [birth year], 48 y.o.   MRN: [id number]  Chief Complaint  Patient presents with   ADHD  Discussed the use of AI scribe software for clinical note transcription with the patient, who gave verbal consent to proceed.  History of Present Illness Nicole David is a 48 year old female who presents for ADHD follow up. She experiences daily headaches with Vyvanse  at a dose of 20 mg, persisting beyond the initial period of use. Vyvanse  has slightly improved focus and attention. She has a history of migraines, previously managed with propranolol , but is not currently on it. Anxiety is managed with escitalopram , though its effectiveness is in question. She uses Lunesta  as needed for sleep. Leg pain, particularly in the knees and legs, is present and possibly related to back issues. Gabapentin  has been ineffective for this pain, but helps her back pain.  Assessment & Plan ADHD - Daily headaches from Vyvanse  20 mg. Limited improvement in focus. Discussed switching to Focalin to reduce side effects and improve efficacy, also states Vyvanse  cost her $125/month. Explained potential side effects of stimulants.  - Switch to Focalin XR starting at 10 mg. - Send three prescriptions to the pharmacy. - Monitor for side effects: headaches, jitteriness, anxiety, increased heart rate, palpitations, blood pressure changes. - Instruct her to report ineffectiveness or side effects of Focalin via MyChart after first month. - F/U in 3 mos  Anxiety & Depression -  Anxiety possibly worsened by ADHD medication wearing off. Also reports fatigue and lack of engagement, possibly due to depression or chronic pain. Discussed impact of pain on mood.On maximum dose of escitalopram . Discussed monitoring irritability when ADHD medication wears off. - Continue escitalopram  at current dose, sending refill. - Monitor for irritability and anxiety, especially as ADHD medication wears  off. - Consider medication adjustment or adding prn med if anxiety or irritability persists. - F/U in 3 mos  Chronic back pain - Chronic back pain, taking Gabapentin  300mg  tid and Flexeril  10mg  tid prn. - Sending refill of Gabapentin . - Consider [facility] for medication delivery if issues persist. - F/U in 6 mos   Subjective:     Outpatient Medications Prior to Visit  Medication Sig Dispense Refill   Cholecalciferol (VITAMIN D3) 1.25 MG (50000 UT) CAPS Take 1 capsule (1.25 mg total) by mouth once a week. 12 capsule 3   cyclobenzaprine  (FLEXERIL ) 10 MG tablet TAKE 1 TABLET(10 MG) BY MOUTH THREE TIMES DAILY AS NEEDED FOR MUSCLE SPASMS 60 tablet 2   escitalopram  (LEXAPRO ) 20 MG tablet TAKE 1 TABLET(20 MG) BY MOUTH DAILY 90 tablet 1   eszopiclone  (LUNESTA ) 2 MG TABS tablet TAKE 1 TABLET BY MOUTH IMMEDIATELY BEFORE BEDTIME AS NEEDED 30 tablet 2   fluticasone  (FLONASE ) 50 MCG/ACT nasal spray SHAKE LIQUID AND USE 1 SPRAY IN EACH NOSTRIL bid for 3 days, then daily. 16 g 2   gabapentin  (NEURONTIN ) 300 MG capsule TAKE 1 CAPSULE(300 MG) BY MOUTH THREE TIMES DAILY AS NEEDED 90 capsule 2   lisdexamfetamine (VYVANSE ) 20 MG capsule Take 1 capsule (20 mg total) by mouth daily. 30 capsule 0   loratadine (CLARITIN) 10 MG tablet Take by mouth.     Prenatal Vit-Iron Carbonyl-FA (THRIVITE RX) 29-1 MG TABS Take 1 tablet by mouth daily. 30 tablet 5   propranolol  ER (INDERAL  LA) 60 MG 24 hr capsule Take 1 capsule (60 mg total) by mouth daily. For headache prevention, elevated heart rate and anxiety. 30  capsule 5   clobetasol  (TEMOVATE ) 0.05 % external solution Apply 1 Application topically 2 (two) times daily. Do not use more than 2 weeks consistently. (Patient not taking: Reported on 06/03/2023) 50 mL 0   No facility-administered medications prior to visit.   Past Medical History:  Diagnosis Date   Anemia    Anxiety    Depression    GERD (gastroesophageal reflux disease)    Hiatal hernia     History of palpitations (05-31-2020  pt denies any issues)   pt has work-up including ekg and event monitor done 07/ 2021, ekg okay and montior showed SR -- ST with rare PAC/ PVC (results in care everhwhere   Infertility, female    Insomnia    Migraines    Wears glasses    Past Surgical History:  Procedure Laterality Date   DILATION AND EVACUATION N/A 06/04/2020   Procedure: DILATATION AND EVACUATION;  Surgeon: Atlas Blank, DO;  Location: [facility];  Service: Gynecology;  Laterality: N/A;  Wants Chromosome studies done   DILATION AND EVACUATION N/A 08/05/2020   Procedure: DILATATION AND EVACUATION;  Surgeon: Atlas Blank, DO;  Location: [facility];  Service: Gynecology;  Laterality: N/A;   ESOPHAGOGASTRODUODENOSCOPY  08/2019   HERNIA REPAIR     MICROTUBOPLASTY  2014   bilateral tubal reversal   OVUM / OOCYTE RETRIEVAL  x2  2013   TUBAL LIGATION Bilateral 2001   WISDOM TOOTH EXTRACTION     Allergies  Allergen Reactions   Sulfa Antibiotics Nausea And Vomiting and Rash   Zithromax [Azithromycin] Rash   Zyrtec [Cetirizine] Rash      Objective:    Physical Exam Vitals and nursing note reviewed.  Constitutional:      Appearance: Normal appearance.  Cardiovascular:     Rate and Rhythm: Normal rate and regular rhythm.  Pulmonary:     Effort: Pulmonary effort is normal.     Breath sounds: Normal breath sounds.  Musculoskeletal:        General: Normal range of motion.  Skin:    General: Skin is warm and dry.  Neurological:     Mental Status: She is alert.  Psychiatric:        Mood and Affect: Mood normal.        Behavior: Behavior normal.   BP 117/83 (BP Location: Left Arm, Patient Position: Sitting, Cuff Size: Large)   Pulse 83   Temp (!) 97.3 F (36.3 C) (Temporal)   Ht 5\' 2"  (1.575 m)   LMP 04/22/2023 (Exact Date)   BMI 35.26 kg/m  Wt Readings from Last 3 Encounters:  05/05/23 192 lb 12.8 oz (87.5 kg)  02/25/23 188 lb 9.6  oz (85.5 kg)  01/25/23 190 lb 12.8 oz (86.5 kg)      Versa Gore, NP

## 2023-06-03 NOTE — Assessment & Plan Note (Signed)
 Daily headaches from Vyvanse  20 mg. Limited improvement in focus. Discussed switching to Focalin to reduce side effects and improve efficacy, also states Vyvanse  cost her $125/month. Explained potential side effects of stimulants.  - Switch to Focalin XR starting at 10 mg. - Send three prescriptions to the pharmacy. - Monitor for side effects: headaches, jitteriness, anxiety, increased heart rate, palpitations, blood pressure changes. - Instruct her to report ineffectiveness or side effects of Focalin via MyChart after first month. - F/U in 3 mos

## 2023-06-03 NOTE — Assessment & Plan Note (Signed)
 Chronic back pain, taking Gabapentin  300mg  tid and Flexeril  10mg  tid prn. - Sending refill of Gabapentin . - Consider [facility] for medication delivery if issues persist. - F/U in 6 mos

## 2023-07-14 ENCOUNTER — Encounter: Payer: Self-pay | Admitting: Family

## 2023-07-15 ENCOUNTER — Telehealth: Payer: Self-pay

## 2023-07-15 DIAGNOSIS — F900 Attention-deficit hyperactivity disorder, predominantly inattentive type: Secondary | ICD-10-CM

## 2023-07-18 NOTE — Telephone Encounter (Signed)
 already responded to - she has RX on file at pharmacy

## 2023-07-22 ENCOUNTER — Encounter: Payer: Self-pay | Admitting: Family

## 2023-07-22 DIAGNOSIS — E611 Iron deficiency: Secondary | ICD-10-CM

## 2023-07-26 NOTE — Telephone Encounter (Signed)
 Since we started a new medication I like have a 1 month f/u but did put down 3 mos in notes. How is she doing on the Focalin ? we switched from Vyvanse . I can refill this for 1 month- but she needs a follow up appointment before the next refill. Also I do not have Lorazepam  or a prenatal vitamin down for her? Let me know, thx

## 2023-07-27 ENCOUNTER — Other Ambulatory Visit: Payer: Self-pay | Admitting: Family

## 2023-07-27 DIAGNOSIS — F411 Generalized anxiety disorder: Secondary | ICD-10-CM

## 2023-07-27 MED ORDER — LORAZEPAM 0.5 MG PO TABS: 0.5000 mg | ORAL_TABLET | Freq: Every day | ORAL | Status: DC | PRN

## 2023-07-28 ENCOUNTER — Other Ambulatory Visit: Payer: Self-pay | Admitting: Family

## 2023-07-28 DIAGNOSIS — F411 Generalized anxiety disorder: Secondary | ICD-10-CM

## 2023-07-28 MED ORDER — LORAZEPAM 0.5 MG PO TABS: 0.5000 mg | ORAL_TABLET | Freq: Every day | ORAL | 0 refills | Status: DC | PRN

## 2023-07-29 ENCOUNTER — Telehealth: Payer: Self-pay

## 2023-07-29 DIAGNOSIS — E611 Iron deficiency: Secondary | ICD-10-CM | POA: Insufficient documentation

## 2023-07-29 MED ORDER — SLOW RELEASE IRON 45 MG PO TBCR: 1.0000 | EXTENDED_RELEASE_TABLET | Freq: Every day | ORAL | 1 refills | Status: AC

## 2023-07-29 NOTE — Addendum Note (Signed)
 Addended by: Sofya Moustafa on: 07/29/2023 08:47 AM   Modules accepted: Orders

## 2023-07-29 NOTE — Telephone Encounter (Signed)
 Iron  tabs, Focalin , Lorazepam  should all be at [facility] on [address]- please call pharm to confirm receipt & let pt know, thx.

## 2023-07-29 NOTE — Telephone Encounter (Signed)
 Copied from CRM [id number]. Topic: Clinical - Prescription Issue >> Jul 28, 2023  4:27 PM Nicole David wrote: Reason for CRM: Pt stated that she contacted [facility] on [address] and they stated that they don't have the LORazepam . Pt would like for the medication to be resent to the pharmacy so she can go pick it up today.  I called pt and lvm, pharmacy stated RX is ready for pick up.

## 2023-09-01 ENCOUNTER — Other Ambulatory Visit: Payer: Self-pay | Admitting: Family

## 2023-09-01 DIAGNOSIS — M5136 Other intervertebral disc degeneration, lumbar region with discogenic back pain only: Secondary | ICD-10-CM

## 2023-09-01 DIAGNOSIS — M5030 Other cervical disc degeneration, unspecified cervical region: Secondary | ICD-10-CM

## 2023-09-03 ENCOUNTER — Encounter: Payer: Self-pay | Admitting: Family

## 2023-09-03 ENCOUNTER — Telehealth: Payer: Self-pay

## 2023-09-03 ENCOUNTER — Other Ambulatory Visit (HOSPITAL_COMMUNITY): Payer: Self-pay

## 2023-09-03 ENCOUNTER — Ambulatory Visit: Admitting: Family

## 2023-09-03 VITALS — BP 104/73 | HR 97 | Temp 97.7°F | Ht 62.0 in | Wt 194.2 lb

## 2023-09-03 DIAGNOSIS — K3184 Gastroparesis: Secondary | ICD-10-CM

## 2023-09-03 DIAGNOSIS — F411 Generalized anxiety disorder: Secondary | ICD-10-CM

## 2023-09-03 DIAGNOSIS — G43709 Chronic migraine without aura, not intractable, without status migrainosus: Secondary | ICD-10-CM

## 2023-09-03 DIAGNOSIS — F900 Attention-deficit hyperactivity disorder, predominantly inattentive type: Secondary | ICD-10-CM

## 2023-09-03 DIAGNOSIS — M79672 Pain in left foot: Secondary | ICD-10-CM

## 2023-09-03 MED ORDER — DEXMETHYLPHENIDATE HCL ER 15 MG PO CP24: 15.0000 mg | ORAL_CAPSULE | ORAL | 0 refills | Status: AC

## 2023-09-03 MED ORDER — DEXMETHYLPHENIDATE HCL ER 15 MG PO CP24: 15.0000 mg | ORAL_CAPSULE | Freq: Every day | ORAL | 0 refills | Status: AC

## 2023-09-03 MED ORDER — VENLAFAXINE HCL ER 37.5 MG PO CP24
37.5000 mg | ORAL_CAPSULE | Freq: Every day | ORAL | 5 refills | Status: AC
Start: 2023-09-03 — End: ?

## 2023-09-03 MED ORDER — TOPIRAMATE 50 MG PO TABS
50.0000 mg | ORAL_TABLET | Freq: Two times a day (BID) | ORAL | 0 refills | Status: DC
Start: 2023-09-03 — End: 2023-11-30

## 2023-09-03 MED ORDER — EMGALITY 120 MG/ML ~~LOC~~ SOAJ: 120.0000 mg | SUBCUTANEOUS | 5 refills | Status: DC

## 2023-09-03 NOTE — Assessment & Plan Note (Signed)
 Migraines occurring at least three times a week. Previous propranolol  ineffective. Considering Emgality  injections pending insurance approval. Topamax  is an alternative. - Attempt to obtain insurance approval for Emgality  injections. - Prescribe Topamax  as an alternative if Emgality  is not approved. - Monitor response to migraine treatment. - F/U in 1 mo

## 2023-09-03 NOTE — Assessment & Plan Note (Signed)
 Gastroparesis with bloating, stomach pain, nausea and hypoglycemia. Blood sugar drops postprandially. Dietary modifications and glucose intake management. Reglan discussed with reversible side effects. Per review of GI note on 06/25/2023: It is certainly possible that she may have had a vagus nerve injury which would be a good unifying diagnosis here leading to erratic gastric motility which could explain her nausea, diaphoresis, and hypoglycemia. States usually resolves in 12-18mos. - Consider trial of Reglan for gastroparesis, monitor for side effects such as irregular facial movements. - Continue dietary modifications with small, frequent meals and liquid-based foods. - Monitor blood sugar levels with a glucometer. - Use glucose tablets or small amounts of juice for hypoglycemia management. - Continue to follow with GI

## 2023-09-03 NOTE — Telephone Encounter (Signed)
 Pharmacy Patient Advocate Encounter   Received notification from Pt Calls Messages that prior authorization for Emgality  120MG /ML auto-injectors (migraine) is required/requested.   Insurance verification completed.   The patient is insured through [employer] .   Per test claim: PA required; PA submitted to above mentioned insurance via Latent Key/confirmation #/EOC [facility] Status is pending

## 2023-09-03 NOTE — Assessment & Plan Note (Signed)
 Long-term Lexapro  use with diminishing effects. Persistent anxiety and depression symptoms. Focalin  may contribute to anxiety. Effexor  considered for its benefits on migraines and depression. - Discontinue Lexapro . - Initiate Effexor  37.5mg  qd for depression and anxiety, advised on possible SE. - Follow up in 1 month to assess response to Effexor .

## 2023-09-03 NOTE — Assessment & Plan Note (Signed)
 Current Focalin  treatment at 10 mg not causing significant side effects. Uncertain efficacy on focus and attention. Gastroparesis may affect absorption. - Increase Focalin  dose to 15 mg and monitor response. - Consider switching to Concerta or Vyvanse  again if needed. - F/U in 1 month

## 2023-09-03 NOTE — Progress Notes (Signed)
 Patient ID: Nicole David, female    DOB: 1975/04/04, 48 y.o.   MRN: [id number]  Chief Complaint  Patient presents with   Foot Pain    Pt c/o left foot pain, Present 5 days ago. Pt is unsure of any injury. Has tried advil.    ADHD   Migraine    Pt would like to restart Topiramate , Propanolol does not help sx.    Depression    Discuss Lexapro  change.  Discussed the use of AI scribe software for clinical note transcription with the patient, who gave verbal consent to proceed.  History of Present Illness   Nicole David is a 48 year old female who presents for follow-up on ADHD medication and to discuss other health concerns.  Attention deficit hyperactivity disorder symptoms and pharmacotherapy - Currently taking Focalin  10 mg for ADHD - Uncertain about effectiveness in improving focus, attention, and organization - No sleep or appetite disturbances with Focalin   Gastrointestinal symptoms and postprandial hypoglycemia - Gastroparesis limits ability to eat certain foods - Symptoms include low blood sugar levels, headaches, bloating, and stomach pain - Severe abdominal pain and swelling after eating - Blood sugar frequently drops to the 50s postprandially, resulting in shaking and dizziness - Manages hypoglycemic episodes with small amounts of juice or candy  Anxiety and depressive symptoms - Chronic anxiety and depression - Long-term use of Lexapro  - Perceives Lexapro  may no longer be effective and is considering a change in medication  Migraine headaches - Migraines occur at least three times per week with nausea - Migraine episodes require cessation of work and lying down - Previous trials of propranolol , no longer effective and hx of Topamax  for migraine prevention  Musculoskeletal pain and swelling - Foot pain, predominantly in one foot, with swelling and tenderness - Pain is most severe in the morning or after periods of rest - Requires support when first  standing due to severe pain - Knee pain with a tearing sensation, especially when ascending steps - Increased physical activity due to caring for mother with cancer and living in a house with many steps, possibly exacerbating foot pain      Assessment & Plan:     Attention-deficit hyperactivity disorder (ADHD) Current Focalin  treatment at 10 mg not causing significant side effects. Uncertain efficacy on focus and attention. Gastroparesis may affect absorption. - Increase Focalin  dose to 15 mg and monitor response. - Consider switching to Concerta or Vyvanse  again if needed. - F/U in 1 month  Major depressive disorder and anxiety disorder Long-term Lexapro  use with diminishing effects. Persistent anxiety and depression symptoms. Focalin  may contribute to anxiety. Effexor  considered for its benefits on migraines and depression. - Discontinue Lexapro . - Initiate Effexor  37.5mg  qd for depression and anxiety, advised on possible SE. - Follow up in 1 month to assess response to Effexor .  Gastroparesis with hypoglycemia Gastroparesis with bloating, stomach pain, nausea and hypoglycemia. Blood sugar drops postprandially. Dietary modifications and glucose intake management. Reglan discussed with reversible side effects. Per review of GI note on 06/25/2023: It is certainly possible that she may have had a vagus nerve injury which would be a good unifying diagnosis here leading to erratic gastric motility which could explain her nausea, diaphoresis, and hypoglycemia. States usually resolves in 12-18mos. - Consider trial of Reglan for gastroparesis, monitor for side effects such as irregular facial movements. - Continue dietary modifications with small, frequent meals and liquid-based foods. - Monitor blood sugar levels with a glucometer. - Use glucose  tablets or small amounts of juice for hypoglycemia management. - Continue to follow with GI  Migraine Migraines occurring at least three times a  week. Previous propranolol  ineffective. Considering Emgality  injections pending insurance approval. Topamax  is an alternative. - Attempt to obtain insurance approval for Emgality  injections. - Prescribe Topamax  as an alternative if Emgality  is not approved. - Monitor response to migraine treatment. - F/U in 1 mo  Suspected plantar fasciitis, right foot Pain and swelling in right plantar heel, severe upon waking and after rest, consistent with plantar fasciitis. - Recommend icing with a frozen water bottle to reduce inflammation, 3x/day. - Advise use of cushioned insoles, such as Dr. Heriberto plantar fasciitis insoles, in all shoes, do not walk barefoot, wear supportive, cushioned shoes. Handout provided on Plantar fasciitis - Encourage wearing cushioned footwear at all times. - Consider referral to podiatry if no improvement in a couple of weeks.      Subjective:    Outpatient Medications Prior to Visit  Medication Sig Dispense Refill   Cholecalciferol (VITAMIN D3) 1.25 MG (50000 UT) CAPS Take 1 capsule (1.25 mg total) by mouth once a week. 12 capsule 3   cyclobenzaprine  (FLEXERIL ) 10 MG tablet TAKE 1 TABLET(10 MG) BY MOUTH THREE TIMES DAILY AS NEEDED FOR MUSCLE SPASMS 60 tablet 2   eszopiclone  (LUNESTA ) 2 MG TABS tablet TAKE 1 TABLET BY MOUTH IMMEDIATELY BEFORE BEDTIME AS NEEDED 30 tablet 2   Ferrous Sulfate Dried (SLOW RELEASE IRON ) 45 MG TBCR Take 1 tablet by mouth daily. 90 tablet 1   fluticasone  (FLONASE ) 50 MCG/ACT nasal spray SHAKE LIQUID AND USE 1 SPRAY IN EACH NOSTRIL bid for 3 days, then daily. 16 g 2   gabapentin  (NEURONTIN ) 300 MG capsule TAKE 1 CAPSULE(300 MG) BY MOUTH THREE TIMES DAILY 90 capsule 2   loratadine (CLARITIN) 10 MG tablet Take by mouth.     LORazepam  (ATIVAN ) 0.5 MG tablet Take 1 tablet (0.5 mg total) by mouth daily as needed for anxiety. 30 tablet 0   escitalopram  (LEXAPRO ) 20 MG tablet Take 1 tablet (20 mg total) by mouth daily. 90 tablet 1   propranolol   ER (INDERAL  LA) 60 MG 24 hr capsule Take 1 capsule (60 mg total) by mouth daily. For headache prevention, elevated heart rate and anxiety. (Patient not taking: Reported on 09/03/2023) 30 capsule 5   dexmethylphenidate  (FOCALIN  XR) 10 MG 24 hr capsule Take 1 capsule (10 mg total) by mouth every morning. 30 capsule 0   dexmethylphenidate  (FOCALIN  XR) 10 MG 24 hr capsule Take 1 capsule (10 mg total) by mouth every morning. 30 capsule 0   dexmethylphenidate  (FOCALIN  XR) 10 MG 24 hr capsule Take 1 capsule (10 mg total) by mouth every morning. 30 capsule 0   No facility-administered medications prior to visit.   Past Medical History:  Diagnosis Date   Anemia    Anxiety    Depression    GERD (gastroesophageal reflux disease)    Hiatal hernia    History of palpitations (05-31-2020  pt denies any issues)   pt has work-up including ekg and event monitor done 07/ 2021, ekg okay and montior showed SR -- ST with rare PAC/ PVC (results in care everhwhere   Infertility, female    Insomnia    Migraines    Wears glasses    Past Surgical History:  Procedure Laterality Date   DILATION AND EVACUATION N/A 06/04/2020   Procedure: DILATATION AND EVACUATION;  Surgeon: Lilton Legions, DO;  Location: [facility];  Service: Gynecology;  Laterality: N/A;  Wants Chromosome studies done   DILATION AND EVACUATION N/A 08/05/2020   Procedure: DILATATION AND EVACUATION;  Surgeon: Lilton Legions, DO;  Location: [facility] [place];  Service: Gynecology;  Laterality: N/A;   ESOPHAGOGASTRODUODENOSCOPY  08/2019   HERNIA REPAIR     MICROTUBOPLASTY  2014   bilateral tubal reversal   OVUM / OOCYTE RETRIEVAL  x2  2013   TUBAL LIGATION Bilateral 2001   WISDOM TOOTH EXTRACTION     Allergies  Allergen Reactions   Sulfa Antibiotics Nausea And Vomiting and Rash   Zithromax [Azithromycin] Rash   Zyrtec [Cetirizine] Rash      Objective:    Physical Exam Vitals and nursing note reviewed.   Constitutional:      Appearance: Normal appearance. She is obese.  Cardiovascular:     Rate and Rhythm: Normal rate and regular rhythm.  Pulmonary:     Effort: Pulmonary effort is normal.     Breath sounds: Normal breath sounds.  Musculoskeletal:        General: Normal range of motion.  Skin:    General: Skin is warm and dry.  Neurological:     Mental Status: She is alert.  Psychiatric:        Mood and Affect: Mood normal.        Behavior: Behavior normal.    BP 104/73 (BP Location: Left Arm, Patient Position: Sitting, Cuff Size: Large)   Pulse 97   Temp 97.7 F (36.5 C) (Temporal)   Ht 5' 2 (1.575 m)   Wt 194 lb 4 oz (88.1 kg)   LMP  (LMP Unknown)   SpO2 98%   BMI 35.53 kg/m  Wt Readings from Last 3 Encounters:  09/03/23 194 lb 4 oz (88.1 kg)  05/05/23 192 lb 12.8 oz (87.5 kg)  02/25/23 188 lb 9.6 oz (85.5 kg)       Lucius Krabbe, NP

## 2023-09-03 NOTE — Patient Instructions (Addendum)
 It was very nice to see you today! She will  I have sent in the higher dose of Focalin , let me know if still not working for your ADHD symptoms. I have also sent the new medication for your migraines Emgality  injectables but this requires a prior authorization first and then we will let you know if it is approved. I have sent in the refill for your Topamax , follow the instructions on the bottle to titrate up the dose.  Go ahead and start this now as I am unsure how long it will take to start the Emgality . I have also sent in the generic Effexor  for your anxiety and depression take every morning.  Stop the Lexapro .  Schedule a 1 month follow-up visit in the office or can be virtual so we can discuss the above changes.      PLEASE NOTE:  If you had any lab tests please let us  know if you have not heard back within a few days. You may see your results on MyChart before we have a chance to review them but we will give you a call once they are reviewed by us . If we ordered any referrals today, please let us  know if you have not heard from their office within the next week.

## 2023-09-04 ENCOUNTER — Other Ambulatory Visit: Payer: Self-pay | Admitting: Family

## 2023-09-04 DIAGNOSIS — M5136 Other intervertebral disc degeneration, lumbar region with discogenic back pain only: Secondary | ICD-10-CM

## 2023-09-04 DIAGNOSIS — M5030 Other cervical disc degeneration, unspecified cervical region: Secondary | ICD-10-CM

## 2023-09-06 NOTE — Telephone Encounter (Addendum)
 I returned pt's call in regards to approval. I let pt know PCP recommends trying Emgality  without Topamax  for 30 days and if not relieving migraines to reach out to provider, Pt verbalized understanding. Pt also states her foot pain is worsening and she has tried all recommendation by PCP. I offered pt a referral to podiatry but pt refuses at this time, she would like a X-RAY first. Please advise.

## 2023-09-06 NOTE — Telephone Encounter (Signed)
 Pharmacy Patient Advocate Encounter  Received notification from [employer] that Prior Authorization for Emgality  120MG /ML auto-injectors (migraine) has been APPROVED from 09/03/23 to 09/02/24   PA #/Case ID/Reference #: [id number]

## 2023-09-07 ENCOUNTER — Encounter: Payer: Self-pay | Admitting: Family

## 2023-09-07 DIAGNOSIS — M79672 Pain in left foot: Secondary | ICD-10-CM

## 2023-09-07 NOTE — Telephone Encounter (Signed)
 yes, I can order the xray. please confirm it is her LEFT foot (heel) where she is having the pain and let me know, thx.

## 2023-09-08 NOTE — Telephone Encounter (Signed)
 MyChart message sent to patient.

## 2023-09-08 NOTE — Telephone Encounter (Signed)
 I have discontinued the emgality  for migraines and ordered the foot xray, needs to schedule time to come in - does not need to see me, thx.

## 2023-09-09 ENCOUNTER — Ambulatory Visit: Payer: Self-pay | Admitting: Family

## 2023-09-09 ENCOUNTER — Ambulatory Visit (INDEPENDENT_AMBULATORY_CARE_PROVIDER_SITE_OTHER)

## 2023-09-09 DIAGNOSIS — M79672 Pain in left foot: Secondary | ICD-10-CM

## 2023-09-15 ENCOUNTER — Ambulatory Visit (INDEPENDENT_AMBULATORY_CARE_PROVIDER_SITE_OTHER): Admitting: Podiatry

## 2023-09-15 ENCOUNTER — Other Ambulatory Visit: Payer: Self-pay | Admitting: Family

## 2023-09-15 ENCOUNTER — Encounter: Payer: Self-pay | Admitting: Podiatry

## 2023-09-15 DIAGNOSIS — Z1231 Encounter for screening mammogram for malignant neoplasm of breast: Secondary | ICD-10-CM

## 2023-09-15 DIAGNOSIS — M722 Plantar fascial fibromatosis: Secondary | ICD-10-CM | POA: Diagnosis not present

## 2023-09-15 MED ORDER — TRIAMCINOLONE ACETONIDE 10 MG/ML IJ SUSP
2.5000 mg | Freq: Once | INTRAMUSCULAR | Status: AC
  Administered 2023-09-15: 2.5 mg via INTRA_ARTICULAR

## 2023-09-15 MED ORDER — MELOXICAM 15 MG PO TABS: 15.0000 mg | ORAL_TABLET | Freq: Every day | ORAL | 0 refills | Status: DC

## 2023-09-15 MED ORDER — DEXAMETHASONE SODIUM PHOSPHATE 120 MG/30ML IJ SOLN
4.0000 mg | Freq: Once | INTRAMUSCULAR | Status: AC
  Administered 2023-09-15: 4 mg via INTRA_ARTICULAR

## 2023-09-15 NOTE — Progress Notes (Signed)
  Subjective:  Patient ID: Nicole David, female    DOB: [birth year],   MRN: [id number]  Chief Complaint  Patient presents with   Foot Pain    My primary doctor said I have Plantar Fasciitis in my left foot.    48 y.o. female presents for concern of left heel pain that has been ongoing for about two weeks. Relates recently increased activity due to taking care of her mother. She relates first steps in the morning or after rest are very painful. She has tried rolling a frozen water bottle and some gel cushions to help the heel without much relief.   . Denies any other pedal complaints. Denies n/v/f/c.   Past Medical History:  Diagnosis Date   Anemia    Anxiety    Depression    GERD (gastroesophageal reflux disease)    Hiatal hernia    History of palpitations (05-31-2020  pt denies any issues)   pt has work-up including ekg and event monitor done 07/ 2021, ekg okay and montior showed SR -- ST with rare PAC/ PVC (results in care everhwhere   Infertility, female    Insomnia    Migraines    Wears glasses     Objective:  Physical Exam: Vascular: DP/PT pulses 2/4 bilateral. CFT <3 seconds. Normal hair growth on digits. No edema.  Skin. No lacerations or abrasions bilateral feet.  Musculoskeletal: MMT 5/5 bilateral lower extremities in DF, PF, Inversion and Eversion. Deceased ROM in DF of ankle joint. Tender to the medial calcaneal tubercle left . No pain with achilles, PT or arch. No pain with calcaneal squeeze.  Neurological: Sensation intact to light touch.   Assessment:   1. Plantar fasciitis, left      Plan:  Patient was evaluated and treated and all questions answered. Discussed plantar fasciitis with patient.  X-rays reviewed from PCP office and discussed with patient. No acute fractures or dislocations noted. Mild spurring noted at inferior calcaneus.  Discussed treatment options including, ice, NSAIDS, supportive shoes, bracing, and stretching. Stretching exercises  provided to be done on a daily basis.   Prescription for meloxicam  provided and sent to pharmacy. Patient requesting injection today. Procedure note below.   Follow-up 6 weeks or sooner if any problems arise. In the meantime, encouraged to call the office with any questions, concerns, change in symptoms.   Procedure:  Discussed etiology, pathology, conservative vs. surgical therapies. At this time a plantar fascial injection was recommended.  The patient agreed and a sterile skin prep was applied.  An injection consisting of  1cc dexamethasone  0.5 cc kenalog  and 1cc marcaine mixture was infiltrated at the point of maximal tenderness on the left Heel.  Bandaid applied. The patient tolerated this well and was given instructions for aftercare.    Asberry Failing, DPM

## 2023-09-15 NOTE — Patient Instructions (Signed)

## 2023-09-16 ENCOUNTER — Encounter: Payer: Self-pay | Admitting: Podiatry

## 2023-09-17 NOTE — Telephone Encounter (Signed)
 No answer- unable to leave message- mailbox not set up

## 2023-09-21 ENCOUNTER — Encounter

## 2023-09-21 DIAGNOSIS — Z1231 Encounter for screening mammogram for malignant neoplasm of breast: Secondary | ICD-10-CM

## 2023-09-24 ENCOUNTER — Encounter: Payer: Self-pay | Admitting: Podiatry

## 2023-09-25 ENCOUNTER — Other Ambulatory Visit: Payer: Self-pay | Admitting: Podiatry

## 2023-09-25 MED ORDER — METHYLPREDNISOLONE 4 MG PO TBPK: ORAL_TABLET | ORAL | 0 refills | Status: AC

## 2023-09-28 ENCOUNTER — Other Ambulatory Visit: Payer: Self-pay | Admitting: Family

## 2023-09-28 DIAGNOSIS — F411 Generalized anxiety disorder: Secondary | ICD-10-CM

## 2023-10-27 ENCOUNTER — Ambulatory Visit: Admitting: Podiatry

## 2023-11-29 ENCOUNTER — Other Ambulatory Visit: Payer: Self-pay | Admitting: Family

## 2023-11-29 DIAGNOSIS — G43709 Chronic migraine without aura, not intractable, without status migrainosus: Secondary | ICD-10-CM

## 2024-01-19 ENCOUNTER — Other Ambulatory Visit: Payer: Self-pay | Admitting: Podiatry
